# Patient Record
Sex: Female | Born: 1975 | Race: White | Hispanic: No | Marital: Married | Smoking: Never smoker
Health system: Southern US, Community
[De-identification: ages and names within clinical notes are randomized; demographics above are authoritative.]

## PROBLEM LIST (undated history)

## (undated) DIAGNOSIS — R5382 Chronic fatigue, unspecified: Secondary | ICD-10-CM

## (undated) DIAGNOSIS — F411 Generalized anxiety disorder: Secondary | ICD-10-CM

## (undated) DIAGNOSIS — F410 Panic disorder [episodic paroxysmal anxiety] without agoraphobia: Secondary | ICD-10-CM

## (undated) DIAGNOSIS — J45909 Unspecified asthma, uncomplicated: Secondary | ICD-10-CM

## (undated) DIAGNOSIS — F4000 Agoraphobia, unspecified: Secondary | ICD-10-CM

## (undated) DIAGNOSIS — E039 Hypothyroidism, unspecified: Secondary | ICD-10-CM

## (undated) DIAGNOSIS — F329 Major depressive disorder, single episode, unspecified: Secondary | ICD-10-CM

## (undated) HISTORY — DX: Agoraphobia, unspecified: F40.00

## (undated) HISTORY — DX: Major depressive disorder, single episode, unspecified: F32.9

## (undated) HISTORY — DX: Hypothyroidism, unspecified: E03.9

## (undated) HISTORY — DX: Chronic fatigue, unspecified: R53.82

## (undated) HISTORY — DX: Panic disorder (episodic paroxysmal anxiety): F41.0

## (undated) HISTORY — DX: Unspecified asthma, uncomplicated: J45.909

## (undated) HISTORY — PX: WISDOM TOOTH EXTRACTION: SHX21

## (undated) HISTORY — DX: Generalized anxiety disorder: F41.1

---
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
## 2000-11-18 ENCOUNTER — Emergency Department (HOSPITAL_COMMUNITY): Admission: EM | Admit: 2000-11-18 | Discharge: 2000-11-18 | Payer: Self-pay

## 2003-02-02 ENCOUNTER — Emergency Department (HOSPITAL_COMMUNITY): Admission: EM | Admit: 2003-02-02 | Discharge: 2003-02-02 | Payer: Self-pay | Admitting: Emergency Medicine

## 2009-04-04 DIAGNOSIS — J45909 Unspecified asthma, uncomplicated: Secondary | ICD-10-CM | POA: Insufficient documentation

## 2010-04-25 ENCOUNTER — Other Ambulatory Visit: Admission: RE | Admit: 2010-04-25 | Discharge: 2010-04-25 | Payer: Self-pay | Admitting: Family Medicine

## 2010-05-20 ENCOUNTER — Ambulatory Visit: Payer: Self-pay | Admitting: Diagnostic Radiology

## 2010-05-20 ENCOUNTER — Emergency Department (HOSPITAL_BASED_OUTPATIENT_CLINIC_OR_DEPARTMENT_OTHER)
Admission: EM | Admit: 2010-05-20 | Discharge: 2010-05-20 | Payer: Self-pay | Source: Home / Self Care | Admitting: Emergency Medicine

## 2010-05-22 ENCOUNTER — Emergency Department (HOSPITAL_BASED_OUTPATIENT_CLINIC_OR_DEPARTMENT_OTHER)
Admission: EM | Admit: 2010-05-22 | Discharge: 2010-05-22 | Payer: Self-pay | Source: Home / Self Care | Admitting: Emergency Medicine

## 2010-05-22 ENCOUNTER — Ambulatory Visit: Payer: Self-pay | Admitting: Diagnostic Radiology

## 2011-03-19 LAB — CBC
HCT: 40.5 % (ref 36.0–46.0)
Hemoglobin: 13.4 g/dL (ref 12.0–15.0)
MCHC: 33.1 g/dL (ref 30.0–36.0)
MCV: 87.4 fL (ref 78.0–100.0)
Platelets: 309 10*3/uL (ref 150–400)
RBC: 4.64 MIL/uL (ref 3.87–5.11)
RDW: 13 % (ref 11.5–15.5)
WBC: 15.1 K/uL — ABNORMAL HIGH (ref 4.0–10.5)

## 2011-03-19 LAB — BASIC METABOLIC PANEL WITH GFR
BUN: 8 mg/dL (ref 6–23)
Calcium: 9.2 mg/dL (ref 8.4–10.5)
Creatinine, Ser: 0.7 mg/dL (ref 0.4–1.2)
GFR calc non Af Amer: >60 mL/min (ref >60)
Sodium: 146 meq/L — ABNORMAL HIGH (ref 135–145)

## 2011-03-19 LAB — DIFFERENTIAL
Basophils Absolute: 0.1 K/uL (ref 0.0–0.1)
Basophils Relative: 0 % (ref 0–1)
Eosinophils Absolute: 0 K/uL (ref 0.0–0.7)
Eosinophils Relative: 0 % (ref 0–5)
Lymphocytes Relative: 15 % (ref 12–46)
Lymphs Abs: 2.2 10*3/uL (ref 0.7–4.0)
Monocytes Absolute: 1.1 10*3/uL — ABNORMAL HIGH (ref 0.1–1.0)
Monocytes Relative: 7 % (ref 3–12)
Neutro Abs: 11.7 10*3/uL — ABNORMAL HIGH (ref 1.7–7.7)
Neutrophils Relative %: 78 % — ABNORMAL HIGH (ref 43–77)

## 2011-03-19 LAB — BASIC METABOLIC PANEL
CO2: 28 mEq/L (ref 19–32)
Chloride: 103 mEq/L (ref 96–112)
GFR calc Af Amer: >60 mL/min (ref >60)
Glucose, Bld: 115 mg/dL — ABNORMAL HIGH (ref 70–99)
Potassium: 3.2 mEq/L — ABNORMAL LOW (ref 3.5–5.1)

## 2016-06-01 ENCOUNTER — Other Ambulatory Visit: Payer: Self-pay | Admitting: Obstetrics and Gynecology

## 2016-06-01 DIAGNOSIS — R928 Other abnormal and inconclusive findings on diagnostic imaging of breast: Secondary | ICD-10-CM

## 2016-06-07 ENCOUNTER — Other Ambulatory Visit: Payer: Self-pay

## 2016-06-13 ENCOUNTER — Ambulatory Visit
Admission: RE | Admit: 2016-06-13 | Discharge: 2016-06-13 | Disposition: A | Discharge status: Home / Self Care | Payer: No Typology Code available for payment source | Source: Ambulatory Visit | Attending: Obstetrics and Gynecology | Admitting: Obstetrics and Gynecology

## 2016-06-13 DIAGNOSIS — R928 Other abnormal and inconclusive findings on diagnostic imaging of breast: Secondary | ICD-10-CM

## 2017-10-08 ENCOUNTER — Telehealth: Payer: Self-pay

## 2017-10-08 ENCOUNTER — Telehealth: Payer: Self-pay | Admitting: Cardiology

## 2017-10-08 NOTE — Telephone Encounter (Signed)
SENT NOTES TO SCHEDULING 

## 2017-10-08 NOTE — Telephone Encounter (Signed)
Received records from [place] for appointment on 10/09/17 with Dr Percival Spanish.  Records put with Dr Hochrein's schedule for 10/09/17. lp

## 2017-10-08 NOTE — Progress Notes (Signed)
Cardiology Office Note   Date:  10/11/2017   ID:  Mallory Collins, DOB July 23, 1976, MRN [id number]  PCP:  Aretta Nip, MD  Cardiologist:   Minus Breeding, MD  Referring:  Aretta Nip, MD  Chief Complaint  Patient presents with  . Tachycardia      History of Present Illness: Mallory Collins is a 41 y.o. female who is referred by Mallory Collins, Mallory Salinas, MD for evaluation of palpitations.  The patient has had a history of a rapid heart rate some years. She says this has been rather restless. She's also had tachycardia palpitations. She describes these intermittent. She says these might happen every few days. They might last for 30 minutes. She describes rapid heartbeat but not necessarily irregular. She did wear an event monitor at another cardiology office. She had runs of supraventricular tachycardia. She had an echocardiogram was unremarkable. I was able to review these results. She's had last thyroid. She was treated at one point metoprolol without improvement.  She took propranolol pill in pocket but doesn't think this is doing anything. She's not having any presyncope or syncope.   The patient denies any new symptoms such as chest discomfort, neck or arm discomfort. There has been no new shortness of breath, PND or orthopnea.   She is not exercising but she can walk her dogs without limitations.    Past Medical History:  Diagnosis Date  . Asthma   . Chronic fatigue   . Hypothyroidism     Past Surgical History:  Procedure Laterality Date  . WISDOM TOOTH EXTRACTION       Current Outpatient Prescriptions  Medication Sig Dispense Refill  . albuterol (PROVENTIL HFA;VENTOLIN HFA) 108 (90 Base) MCG/ACT inhaler Inhale 2 puffs into the lungs every 6 (six) hours as needed for wheezing or shortness of breath.    . cholecalciferol (VITAMIN D) 1000 units tablet Take 1,000 Units by mouth daily.    . DULoxetine (CYMBALTA) 60 MG capsule Take 1 capsule by mouth daily.  1  .  levothyroxine (SYNTHROID, LEVOTHROID) 137 MCG tablet Take 137 mcg by mouth daily.  1  . LORazepam (ATIVAN) 0.5 MG tablet Take 0.5 mg by mouth 2 (two) times daily as needed for anxiety or sleep.     . Multiple Vitamin (MULTIVITAMIN) tablet Take 1 tablet by mouth daily.    . ondansetron (ZOFRAN) 4 MG tablet Take 4 mg by mouth daily as needed for nausea or vomiting.    . propranolol (INDERAL) 10 MG tablet Take 1 tablet by mouth as needed.  0  . diltiazem (CARDIZEM) 30 MG tablet Take 1 tablet (30 mg total) by mouth 2 (two) times daily. 60 tablet 6   No current facility-administered medications for this visit.     Allergies:   Patient has no known allergies.    Social History:  The patient  reports that she has never smoked. She has never used smokeless tobacco.   Family History:  The patient's family history includes CAD (age of onset: 18) in her mother; Cancer in her mother; Heart disease in her paternal grandmother; Hypertension in her mother.    ROS:  Please see the history of present illness.   Otherwise, review of systems are positive for none.   All other systems are reviewed and negative.    PHYSICAL EXAM: VS:  BP 116/80   Pulse 100   Ht 5\' 6"  (1.676 m)   Wt 143 lb (64.9 kg)   LMP 09/25/2017 (Approximate)  SpO2 98%   BMI 23.08 kg/m  , BMI Body mass index is 23.08 kg/m. GENERAL:  Well appearing HEENT:  Pupils equal round and reactive, fundi not visualized, oral mucosa unremarkable NECK:  No jugular venous distention, waveform within normal limits, carotid upstroke brisk and symmetric, no bruits, no thyromegaly LYMPHATICS:  No cervical, inguinal adenopathy LUNGS:  Clear to auscultation bilaterally BACK:  No CVA tenderness CHEST:  Unremarkable HEART:  PMI not displaced or sustained,S1 and S2 within normal limits, no S3, no S4, no clicks, no rubs, no murmurs ABD:  Flat, positive bowel sounds normal in frequency in pitch, no bruits, no rebound, no guarding, no midline pulsatile  mass, no hepatomegaly, no splenomegaly EXT:  2 plus pulses throughout, no edema, no cyanosis no clubbing SKIN:  No rashes no nodules NEURO:  Cranial nerves II through XII grossly intact, motor grossly intact throughout PSYCH:  Cognitively intact, oriented to person place and time    EKG:  EKG is ordered today. The ekg ordered today demonstrates sinus rhythm, rate 96, axis within normal limits, intervals within normal limits, no acute ST.   Recent Labs: No results found for requested labs within last 8760 hours.    Lipid Panel No results found for: CHOL, TRIG, HDL, CHOLHDL, VLDL, LDLCALC, LDLDIRECT    Wt Readings from Last 3 Encounters:  10/09/17 143 lb (64.9 kg)      Other studies Reviewed: Additional studies/ records that were reviewed today include: Office records. Review of the above records demonstrates:  Please see elsewhere in the note.     ASSESSMENT AND PLAN:  TACHYCARDIA:  The patient does seem to have some elevated resting tachycardia but doesn't quite reach the threshold for inappropriate sinus tachycardia. However, I still discussed with her primary therapy of exercise interval training. I would not suggest other therapy specifically for this.  SVT : She does seem to have some runs of supraventricular tach. I talked to her about trying Cardizem 30 mg immediate release twice daily and then we can titrate from there. We talked about vagal maneuvers. Further evaluation and treatment will be based on the results of an AliveCor that I suggested she buy. .    Current medicines are reviewed at length with the patient today.  The patient does not have concerns regarding medicines.  The following changes have been made:  As above  Labs/ tests ordered today include:  No orders of the defined types were placed in this encounter.    Disposition:   FU with me as needed.      Signed, Minus Breeding, MD  10/11/2017 7:41 AM    [facility]

## 2017-10-09 ENCOUNTER — Encounter: Payer: Self-pay | Admitting: *Deleted

## 2017-10-09 ENCOUNTER — Ambulatory Visit (INDEPENDENT_AMBULATORY_CARE_PROVIDER_SITE_OTHER): Payer: Self-pay | Admitting: Cardiology

## 2017-10-09 ENCOUNTER — Encounter: Payer: Self-pay | Admitting: Cardiology

## 2017-10-09 VITALS — BP 116/80 | HR 100 | Ht 66.0 in | Wt 143.0 lb

## 2017-10-09 DIAGNOSIS — I471 Supraventricular tachycardia: Secondary | ICD-10-CM

## 2017-10-09 DIAGNOSIS — R000 Tachycardia, unspecified: Secondary | ICD-10-CM

## 2017-10-09 MED ORDER — DILTIAZEM HCL 30 MG PO TABS: 30.0000 mg | ORAL_TABLET | Freq: Two times a day (BID) | ORAL | 6 refills | Status: DC

## 2017-10-09 NOTE — Patient Instructions (Signed)
Medication Instructions:  START- Cardizem 30 mg twice a day  If you need a refill on your cardiac medications before your next appointment, please call your pharmacy.  Labwork: None Ordered   Testing/Procedures: None Ordered  Follow-Up: Your physician wants you to follow-up in: As Needed.    Thank you for choosing [facility] at [facility]!!

## 2017-10-11 ENCOUNTER — Encounter: Payer: Self-pay | Admitting: Cardiology

## 2017-10-11 DIAGNOSIS — I471 Supraventricular tachycardia, unspecified: Secondary | ICD-10-CM | POA: Insufficient documentation

## 2017-10-11 DIAGNOSIS — R000 Tachycardia, unspecified: Secondary | ICD-10-CM | POA: Insufficient documentation

## 2017-10-14 NOTE — Addendum Note (Signed)
Addended by: Milderd Meager on: 10/14/2017 02:24 PM   Modules accepted: Orders

## 2019-03-23 DIAGNOSIS — E039 Hypothyroidism, unspecified: Secondary | ICD-10-CM | POA: Diagnosis not present

## 2019-03-23 DIAGNOSIS — F410 Panic disorder [episodic paroxysmal anxiety] without agoraphobia: Secondary | ICD-10-CM | POA: Diagnosis not present

## 2019-03-23 DIAGNOSIS — E559 Vitamin D deficiency, unspecified: Secondary | ICD-10-CM | POA: Diagnosis not present

## 2019-04-29 DIAGNOSIS — R000 Tachycardia, unspecified: Secondary | ICD-10-CM | POA: Diagnosis not present

## 2019-04-29 DIAGNOSIS — E039 Hypothyroidism, unspecified: Secondary | ICD-10-CM | POA: Diagnosis not present

## 2019-04-29 DIAGNOSIS — F410 Panic disorder [episodic paroxysmal anxiety] without agoraphobia: Secondary | ICD-10-CM | POA: Diagnosis not present

## 2019-04-30 DIAGNOSIS — F410 Panic disorder [episodic paroxysmal anxiety] without agoraphobia: Secondary | ICD-10-CM | POA: Diagnosis not present

## 2019-04-30 DIAGNOSIS — R000 Tachycardia, unspecified: Secondary | ICD-10-CM | POA: Diagnosis not present

## 2019-04-30 DIAGNOSIS — E039 Hypothyroidism, unspecified: Secondary | ICD-10-CM | POA: Diagnosis not present

## 2019-05-26 DIAGNOSIS — E039 Hypothyroidism, unspecified: Secondary | ICD-10-CM | POA: Diagnosis not present

## 2019-05-26 DIAGNOSIS — I471 Supraventricular tachycardia: Secondary | ICD-10-CM | POA: Diagnosis not present

## 2019-05-26 DIAGNOSIS — F410 Panic disorder [episodic paroxysmal anxiety] without agoraphobia: Secondary | ICD-10-CM | POA: Diagnosis not present

## 2019-06-12 ENCOUNTER — Telehealth: Payer: Self-pay | Admitting: Cardiology

## 2019-06-12 NOTE — Telephone Encounter (Signed)
LVM for patient to call and schedule OV. 

## 2019-06-15 ENCOUNTER — Telehealth: Payer: Self-pay | Admitting: Cardiology

## 2019-06-15 NOTE — Telephone Encounter (Signed)
LVM for patient to call and schedule appointment with Dr. Hochrein. °

## 2019-06-18 ENCOUNTER — Telehealth: Payer: Self-pay | Admitting: Cardiology

## 2019-06-18 NOTE — Telephone Encounter (Signed)
LVM for patient to call and schedule new patient appointment with Dr. Percival Spanish.

## 2019-06-30 NOTE — Progress Notes (Signed)
Virtual Visit via Video Note   This visit type was conducted due to national recommendations for restrictions regarding the COVID-19 Pandemic (e.g. social distancing) in an effort to limit this patient's exposure and mitigate transmission in our community.  Due to her co-morbid illnesses, this patient is at least at moderate risk for complications without adequate follow up.  This format is felt to be most appropriate for this patient at this time.  All issues noted in this document were discussed and addressed.  A limited physical exam was performed with this format.  Please refer to the patient's chart for her consent to telehealth for [facility].   Date:  07/01/2019   ID:  Mallory Collins, DOB 1976-04-19, MRN [id number]  Patient Location: Home Provider Location: Home  PCP:  Aretta Nip, MD  Cardiologist:  Hochrein   Electrophysiologist:  None   Evaluation Performed:  Follow-Up Visit  Chief Complaint:  Cardiac Evaluation with hx of palpitations.   History of Present Illness:    Mallory Collins is a 43 y.o. female we are following for ongoing assessment and management of palpitations.  She did wear an event monitor at another cardiology office and was found to have runs of supraventricular tachycardia.  She was last seen by Dr. Percival Spanish on 10/09/2017 for initial consultation and establishment with our practice.  She has other history of hypothyroidism, chronic fatigue, and asthma.  On that office visit she was noted to have elevated resting tachycardia but did not reach threshold for inappropriate sinus tachycardia.  She was advised to buy AiveCor, cardiac monitor for interpretation of her palpitations and rapid heart rate as an outpatient.  They discussed beginning diltiazem if necessary.  Since being seen last she has not had new diagnoses allergies to medications or surgeries.  She has been working with her primary care physician for hypothyroidism with medication adjustments.   Her primary care physician has placed her on Klonopin which is been very helpful for her rapid heart rhythm, and she has not had any further complaints since starting this medicine.  However on follow-up her primary care physician noticed that she was having splinter hemorrhages in all of her nailbeds, which is apparently a new finding.Marland Kitchen  PCP asked [place] to see her on follow-up.   The patient does not have symptoms concerning for COVID-19 infection (fever, chills, cough, or new shortness of breath).    Past Medical History:  Diagnosis Date  . Asthma   . Chronic fatigue   . Hypothyroidism       Current Meds  Medication Sig  . albuterol (PROVENTIL HFA;VENTOLIN HFA) 108 (90 Base) MCG/ACT inhaler Inhale 2 puffs into the lungs every 6 (six) hours as needed for wheezing or shortness of breath.  . cholecalciferol (VITAMIN D) 1000 units tablet Take 1,000 Units by mouth daily.  . clonazePAM (KLONOPIN) 1 MG tablet Take 0.5-1 tablets by mouth 2 (two) times daily as needed for anxiety.  Marland Kitchen diltiazem (CARDIZEM) 30 MG tablet Take 1 tablet (30 mg total) by mouth 2 (two) times daily.  . DULoxetine (CYMBALTA) 60 MG capsule Take 1 capsule by mouth daily.  Marland Kitchen levothyroxine (SYNTHROID) 150 MCG tablet Take 150 mcg by mouth every other day. Alternating w/ 137 mcg tablets  . levothyroxine (SYNTHROID, LEVOTHROID) 137 MCG tablet Take 137 mcg by mouth every other day. Alternating w/ 150 mcg tablets  . LORazepam (ATIVAN) 0.5 MG tablet Take 0.5 mg by mouth 2 (two) times daily as needed for anxiety or sleep.   Marland Kitchen  Multiple Vitamin (MULTIVITAMIN) tablet Take 1 tablet by mouth daily.  . ondansetron (ZOFRAN) 4 MG tablet Take 4 mg by mouth daily as needed for nausea or vomiting.  . propranolol (INDERAL) 10 MG tablet Take 1 tablet by mouth as needed.     Allergies:   Patient has no known allergies.   Social History   Tobacco Use  . Smoking status: Never Smoker  . Smokeless tobacco: Never Used  Substance Use Topics  .  Alcohol use: Not on file  . Drug use: Not on file     Family Hx: The patient's family history includes CAD (age of onset: 23) in her mother; Cancer in her mother; Heart disease in her paternal grandmother; Hypertension in her mother.  ROS:   Please see the history of present illness.    All other systems reviewed and are negative.   Prior CV studies:   The following studies were reviewed today: See scanned documents   Labs/Other Tests and Data Reviewed:    EKG:  No ECG reviewed.  Recent Labs: No results found for requested labs within last 8760 hours.   Recent Lipid Panel No results found for: CHOL, TRIG, HDL, CHOLHDL, LDLCALC, LDLDIRECT  Wt Readings from Last 3 Encounters:  07/01/19 140 lb (63.5 kg)  10/09/17 143 lb (64.9 kg)     Objective:    Vital Signs:  Ht 5\' 6"  (1.676 m)   Wt 140 lb (63.5 kg)   BMI 22.60 kg/m    VITAL SIGNS:  reviewed GEN:  no acute distress RESPIRATORY:  normal respiratory effort, symmetric expansion NEURO:  alert and oriented x 3, no obvious focal deficit PSYCH:  normal affect  ASSESSMENT & PLAN:    1. Palpations: Essentially resolved when PCP placed her on Klonopin.  She states that she has not noticed any further episodes or racing heart rate.  She has been off of caffeine now for 2 years.  However, she does have occasional extra beats but these are rare.  These are up concerned for her and she is anxious about having problems with her heart again.  I am ordering a 2-week cardiac monitor to evaluate her heart rhythm, rate, and for arrhythmias.  I doubt that we will see significant abnormalities as she is completely asymptomatic since starting Klonopin however she would like to know what her heart rate is doing.  We will therefore, look again.  2.  Splinter hemorrhages: Noted to be under her nails by her PCP.  I will order an echocardiogram to evaluate for vegetation or changes in LV function.  The patient is asymptomatic with no fevers,  myalgias, chest pain, or malaise.  Doubt endocarditis.  3.  Hypothyroidism: Being followed by primary care with medication dosage adjustments.  COVID-19 Education: The signs and symptoms of COVID-19 were discussed with the patient and how to seek care for testing (follow up with PCP or arrange E-visit).  The importance of social distancing was discussed today.  Time:   Today, I have spent 15 minutes with the patient with telehealth technology discussing the above problems.     Medication Adjustments/Labs and Tests Ordered: Current medicines are reviewed at length with the patient today.  Concerns regarding medicines are outlined above.   Tests Ordered: Orders Placed This Encounter  Procedures  . LONG TERM MONITOR (3-14 DAYS)  . ECHOCARDIOGRAM COMPLETE    Medication Changes: No orders of the defined types were placed in this encounter.   Disposition:  Follow up 3-4 weeks.  Signed, Phill Myron. West Pugh, ANP, AACC  07/01/2019 2:48 PM    [facility]

## 2019-07-01 ENCOUNTER — Telehealth (INDEPENDENT_AMBULATORY_CARE_PROVIDER_SITE_OTHER): Payer: No Typology Code available for payment source | Admitting: Adult Health

## 2019-07-01 ENCOUNTER — Telehealth: Payer: Self-pay | Admitting: Radiology

## 2019-07-01 VITALS — Ht 66.0 in | Wt 140.0 lb

## 2019-07-01 DIAGNOSIS — R000 Tachycardia, unspecified: Secondary | ICD-10-CM

## 2019-07-01 DIAGNOSIS — I519 Heart disease, unspecified: Secondary | ICD-10-CM

## 2019-07-01 NOTE — Patient Instructions (Signed)
Medication Instructions:  Continue current medications  If you need a refill on your cardiac medications before your next appointment, please call your pharmacy.  Labwork: None Ordered   Testing/Procedures: Your physician has requested that you have an echocardiogram. Echocardiography is a painless test that uses sound waves to create images of your heart. It provides your doctor with information about the size and shape of your heart and how well your heart's chambers and valves are working. This procedure takes approximately one hour. There are no restrictions for this procedure.  Your physician has recommended that you wear a Zio Patch monitor. Zio monitors are medical devices that record the heart's electrical activity. Doctors most often use these monitors to diagnose arrhythmias. Arrhythmias are problems with the speed or rhythm of the heartbeat. The monitor is a small, portable device. You can wear one while you do your normal daily activities. This is usually used to diagnose what is causing palpitations/syncope (passing out).  Follow-Up: . Your physician recommends that you schedule a follow-up appointment in: 1 Month with Dr Percival Spanish   At [facility], you and your health needs are our priority.  As part of our continuing mission to provide you with exceptional heart care, we have created designated Provider Care Teams.  These Care Teams include your primary Cardiologist (physician) and Advanced Practice Providers (APPs -  Physician Assistants and Nurse Practitioners) who all work together to provide you with the care you need, when you need it.  Thank you for choosing [facility] at [facility] [place]!!

## 2019-07-01 NOTE — Telephone Encounter (Signed)
Enrolled patient for a 14 day Zio monitor to be mailed. Brief instructions were gone over and patient knows to expect the monitor to arrive in 3-4 days 

## 2019-07-02 ENCOUNTER — Telehealth: Payer: Self-pay | Admitting: *Deleted

## 2019-07-02 NOTE — Telephone Encounter (Signed)
Left message for patient to call and schedule Echo and 1 month follow up visit with Dr. Percival Spanish

## 2019-07-07 NOTE — Telephone Encounter (Signed)
Left message for patient to call and schedule Echo and 1 month follow up appointment with Dr. Percival Spanish

## 2019-07-09 NOTE — Telephone Encounter (Signed)
Left message for patient to call and schedule Echo and 1 month follow up with Dr. Percival Spanish 07/02/19--07/06/19--07/07/19.  Called 07/09/19 and voice mail was full.  Sent My Chart message to patient requesting she call.

## 2019-07-11 ENCOUNTER — Other Ambulatory Visit (INDEPENDENT_AMBULATORY_CARE_PROVIDER_SITE_OTHER): Payer: Self-pay

## 2019-07-11 DIAGNOSIS — R000 Tachycardia, unspecified: Secondary | ICD-10-CM

## 2019-07-15 NOTE — Telephone Encounter (Signed)
Left message for patient to call and schedule Echo and 1 month follow up appointment with Dr. Percival Spanish

## 2019-07-16 DIAGNOSIS — E039 Hypothyroidism, unspecified: Secondary | ICD-10-CM | POA: Diagnosis not present

## 2019-07-17 ENCOUNTER — Encounter

## 2019-07-21 NOTE — Telephone Encounter (Signed)
Patient has not responded to messages left 07/02/19, 07/06/19,07/07/19,07/15/19 or a MY Chart message sent on 07/09/19

## 2019-08-03 ENCOUNTER — Ambulatory Visit (HOSPITAL_COMMUNITY): Payer: Self-pay | Attending: Cardiology

## 2019-08-03 ENCOUNTER — Other Ambulatory Visit: Payer: Self-pay

## 2019-08-03 DIAGNOSIS — I519 Heart disease, unspecified: Secondary | ICD-10-CM | POA: Insufficient documentation

## 2019-09-17 ENCOUNTER — Encounter

## 2019-12-14 DIAGNOSIS — F410 Panic disorder [episodic paroxysmal anxiety] without agoraphobia: Secondary | ICD-10-CM | POA: Diagnosis not present

## 2019-12-14 DIAGNOSIS — E039 Hypothyroidism, unspecified: Secondary | ICD-10-CM | POA: Diagnosis not present

## 2019-12-14 DIAGNOSIS — F411 Generalized anxiety disorder: Secondary | ICD-10-CM | POA: Diagnosis not present

## 2020-05-21 ENCOUNTER — Other Ambulatory Visit: Payer: Self-pay | Admitting: Physician Assistant

## 2020-05-21 DIAGNOSIS — J45909 Unspecified asthma, uncomplicated: Secondary | ICD-10-CM

## 2020-05-21 DIAGNOSIS — U071 COVID-19: Secondary | ICD-10-CM

## 2020-05-21 MED ORDER — SODIUM CHLORIDE 0.9 % IV SOLN
Freq: Once | INTRAVENOUS | Status: DC
  Filled 2020-05-21: qty 20

## 2020-05-21 NOTE — Progress Notes (Signed)
  I connected by phone with Mallory Collins on 05/21/2020 at 8:33 AM to discuss the potential use of an new treatment for mild to moderate COVID-19 viral infection in non-hospitalized patients.  This patient is a 44 y.o. female that meets the FDA criteria for Emergency Use Authorization of bamlanivimab/etesevimab or casirivimab/imdevimab.  Has a (+) direct SARS-CoV-2 viral test result  Has mild or moderate COVID-19   Is ? 45 years of age and weighs ? 40 kg  Is NOT hospitalized due to COVID-19  Is NOT requiring oxygen therapy or requiring an increase in baseline oxygen flow rate due to COVID-19  Is within 10 days of symptom onset  Has at least one of the high risk factor(s) for progression to severe COVID-19 and/or hospitalization as defined in EUA.  Specific high risk criteria : asthma   I have spoken and communicated the following to the patient or parent/caregiver:  1. FDA has authorized the emergency use of bamlanivimab/etesevimab and casirivimab\imdevimab for the treatment of mild to moderate COVID-19 in adults and pediatric patients with positive results of direct SARS-CoV-2 viral testing who are 40 years of age and older weighing at least 40 kg, and who are at high risk for progressing to severe COVID-19 and/or hospitalization.  2. The significant known and potential risks and benefits of bamlanivimab/etesevimab and casirivimab\imdevimab, and the extent to which such potential risks and benefits are unknown.  3. Information on available alternative treatments and the risks and benefits of those alternatives, including clinical trials.  4. Patients treated with bamlanivimab/etesevimab and casirivimab\imdevimab should continue to self-isolate and use infection control measures (e.g., wear mask, isolate, social distance, avoid sharing personal items, clean and disinfect "high touch" surfaces, and frequent handwashing) according to CDC guidelines.   5. The patient or parent/caregiver has  the option to accept or refuse bamlanivimab/etesevimab or casirivimab\imdevimab .  After reviewing this information with the patient, The patient agreed to proceed with receiving the bamlanivimab/etesevimab infusion and will be provided a copy of the Fact sheet prior to receiving the infusion.  Sx onset 5/18. Set up for infusion tomorrow 5/23 @ 9:30am   Angelena Form 05/21/2020 8:33 AM

## 2020-05-22 ENCOUNTER — Ambulatory Visit (HOSPITAL_COMMUNITY)
Admission: RE | Admit: 2020-05-22 | Discharge: 2020-05-22 | Disposition: A | Discharge status: Home / Self Care | Payer: BC Managed Care – PPO | Source: Ambulatory Visit | Attending: Pulmonary Disease | Admitting: Pulmonary Disease

## 2020-05-22 DIAGNOSIS — U071 COVID-19: Secondary | ICD-10-CM

## 2020-05-22 DIAGNOSIS — J45909 Unspecified asthma, uncomplicated: Secondary | ICD-10-CM

## 2020-05-24 ENCOUNTER — Ambulatory Visit: Payer: Self-pay | Admitting: Family Medicine

## 2020-06-07 ENCOUNTER — Encounter: Payer: Self-pay | Admitting: Family Medicine

## 2020-06-07 ENCOUNTER — Ambulatory Visit (INDEPENDENT_AMBULATORY_CARE_PROVIDER_SITE_OTHER): Payer: BC Managed Care – PPO | Admitting: Family Medicine

## 2020-06-07 ENCOUNTER — Other Ambulatory Visit: Payer: Self-pay

## 2020-06-07 VITALS — BP 132/78 | HR 125 | Temp 97.9°F | Ht 64.0 in | Wt 140.1 lb

## 2020-06-07 DIAGNOSIS — J4520 Mild intermittent asthma, uncomplicated: Secondary | ICD-10-CM

## 2020-06-07 DIAGNOSIS — F419 Anxiety disorder, unspecified: Secondary | ICD-10-CM | POA: Diagnosis not present

## 2020-06-07 DIAGNOSIS — D229 Melanocytic nevi, unspecified: Secondary | ICD-10-CM

## 2020-06-07 DIAGNOSIS — M797 Fibromyalgia: Secondary | ICD-10-CM

## 2020-06-07 DIAGNOSIS — E039 Hypothyroidism, unspecified: Secondary | ICD-10-CM | POA: Diagnosis not present

## 2020-06-07 NOTE — Progress Notes (Signed)
  Subjective:     Patient ID: Mallory Collins, female   DOB: 02/26/1976, 44 y.o.   MRN: [id number]  HPI   Mallory Collins is seen to establish care.  She has history of SVT which has been followed by cardiology.  Takes as needed beta-blockers.  She has reported mild intermittent asthma and rarely has asthma flares.  She has reported history of Hashimoto's thyroiditis with subsequent hypothyroidism.  She is on replacement.  She states her last levels were checked about a year ago.  She has had some anxiety issues over the past several months.  She states since the pandemic she has rarely left her house over the past 6 months.  She has reported fibromyalgia history and is on Cymbalta.  She specifically would like to see psychiatrist.  She has taken things like Klonopin but felt more fatigued with that.  She feels that she predominately has anxiety symptoms rather than depression.  She prefers to try to avoid benzodiazepines chronically.  She states she was diagnosed with Covid several weeks ago.  She has had some fatigue as possible residual symptom but had some fatigue even prior to that.  No cough.  No significant dyspnea.  Family history significant for mother having breast cancer history.  She states her father had amyloidosis with multiple organ involvement including end-stage renal disease.  She has 1 sister who is fairly healthy.  Pt sees gyn and gets regular mammograms.  She is married.  Non-smoker.  No alcohol use.  Past Medical History:  Diagnosis Date  . Asthma   . Chronic fatigue   . Hypothyroidism      reports that she has never smoked. She has never used smokeless tobacco. No history on file for alcohol and drug. family history includes CAD (age of onset: 65) in her mother; Cancer in her mother; Heart disease in her paternal grandmother; Hypertension in her mother. No Known Allergies   Review of Systems  Constitutional: Negative for appetite change, chills, fever and unexpected weight  change.  Respiratory: Negative for cough and shortness of breath.   Cardiovascular: Negative for chest pain.       Objective:   Physical Exam Constitutional:      Appearance: She is well-developed.  Eyes:     Pupils: Pupils are equal, round, and reactive to light.  Neck:     Thyroid: No thyromegaly.     Vascular: No JVD.  Cardiovascular:     Rate and Rhythm: Normal rate and regular rhythm.     Heart sounds: No gallop.   Pulmonary:     Effort: Pulmonary effort is normal. No respiratory distress.     Breath sounds: Normal breath sounds. No wheezing or rales.  Musculoskeletal:     Cervical back: Neck supple.  Neurological:     Mental Status: She is alert.        Assessment:     #1 hypothyroidism.  She is on replacement.  History of reported Hashimoto's thyroiditis.  Due for follow-up labs  #2 history of mild intermittent asthma  #3 history of reported fibromyalgia  #4 increased anxiety symptoms since the pandemic    Plan:     -Future order placed for TSH -We will need refills of her levothyroxine soon -Place order for psychiatry referral per her request -Set up dermatology referral for basic skin cancer screening.  She has had some "precancerous" (?dysplastic) lesions previously  Eulas Post MD [facility] at [facility]

## 2020-06-17 ENCOUNTER — Encounter: Payer: Self-pay | Admitting: Family Medicine

## 2020-06-20 MED ORDER — LORAZEPAM 0.5 MG PO TABS: 0.5000 mg | ORAL_TABLET | Freq: Two times a day (BID) | ORAL | 0 refills | Status: DC | PRN

## 2020-06-20 MED ORDER — ONDANSETRON HCL 4 MG PO TABS: 4.0000 mg | ORAL_TABLET | Freq: Three times a day (TID) | ORAL | 0 refills | Status: DC | PRN

## 2020-06-20 MED ORDER — DULOXETINE HCL 60 MG PO CPEP: 60.0000 mg | ORAL_CAPSULE | Freq: Every day | ORAL | 1 refills | Status: DC

## 2020-06-20 NOTE — Telephone Encounter (Signed)
I went ahead and gave her a refill of these medications until she can see psychiatry.  I did not see Klonopin on her list.

## 2020-07-15 ENCOUNTER — Other Ambulatory Visit: Payer: Self-pay | Admitting: Family Medicine

## 2020-08-11 ENCOUNTER — Other Ambulatory Visit: Payer: Self-pay

## 2020-08-11 ENCOUNTER — Ambulatory Visit (INDEPENDENT_AMBULATORY_CARE_PROVIDER_SITE_OTHER): Payer: BC Managed Care – PPO | Admitting: Adult Health

## 2020-08-11 ENCOUNTER — Encounter: Payer: Self-pay | Admitting: Adult Health

## 2020-08-11 VITALS — BP 150/89 | HR 113 | Ht 65.0 in | Wt 142.0 lb

## 2020-08-11 DIAGNOSIS — F331 Major depressive disorder, recurrent, moderate: Secondary | ICD-10-CM | POA: Diagnosis not present

## 2020-08-11 DIAGNOSIS — F411 Generalized anxiety disorder: Secondary | ICD-10-CM

## 2020-08-11 DIAGNOSIS — F410 Panic disorder [episodic paroxysmal anxiety] without agoraphobia: Secondary | ICD-10-CM

## 2020-08-11 DIAGNOSIS — F4000 Agoraphobia, unspecified: Secondary | ICD-10-CM

## 2020-08-11 MED ORDER — LORAZEPAM 0.5 MG PO TABS: 0.5000 mg | ORAL_TABLET | Freq: Every day | ORAL | 2 refills | Status: DC

## 2020-08-11 NOTE — Progress Notes (Signed)
[facility] MD/PA/NP Initial Note  08/11/2020 1:31 PM Patrick Sohm  MRN:  [id number]  Chief Complaint:   HPI:  Describes mood today as "ok". Denies tearfulness. Mood symptoms - reports depression and anxiety. Denies irritability. Stating "I can't handle any stress". Reports symptoms starting in the summer of last year and have progressively gotten worse. Not wanting to leave the house - "not at all". With the exception of doctor's appointment, she hasn't left the house since 2020. PCP gave her you Clonazepam to take when having increased anxiety/panic attacks. Tried taking, but it is too sedating - "it knocks me out". Has taken Cymbalta for Fibromyalgia and has increased to 60mg  daily - "has not helped with mood symptoms". Wanting help with anxiety and feeling agoraphobia. Would like to take something on an as needed basis. Has taken Ativan in the past when she used to travel and felt like it worked well. Likes to read, work around [employer], and watch TV. Stable interest and motivation. Taking medications as prescribed.  Energy levels lower. Active, does not have a regular exercise routine.  Enjoys some usual interests and activities. Married. Lives with husband and mother - married in 2003. Has 3 dogs. Has been with husband since 2003. Parents and extended family local. Spending time with family. Appetite adequate. Weight gain - 142 pounds.  Sleeps well most nights. Averages 10 hours. Focus and concentration stable. Completing tasks. Managing aspects of household. Unable to work - Fibromyalgia. Denies SI or HI. Denies AH or VH.  Previous medication trials: Clonazepam - too sedating  Visit Diagnosis:    ICD-10-CM   1. Generalized anxiety disorder  F41.1   2. Major depressive disorder, recurrent episode, moderate (HCC)  F33.1   3. Panic attacks  F41.0   4. Agoraphobia  F40.00     Past Psychiatric History: Denies psychiatric hospitalization.  Past Medical History:  Past Medical  History:  Diagnosis Date  . Agoraphobia   . Asthma   . Chronic fatigue   . GAD (generalized anxiety disorder)   . Hypothyroidism   . MDD (major depressive disorder)   . Panic attacks     Past Surgical History:  Procedure Laterality Date  . WISDOM TOOTH EXTRACTION      Family Psychiatric History: Denies any family history of mental illness.  Family History:  Family History  Problem Relation Age of Onset  . Hypertension Mother   . Cancer Mother        breast  . CAD Mother 48       CABG, stents  . Heart disease Paternal Grandmother     Social History:  Social History   Socioeconomic History  . Marital status: Married    Spouse name: Not on file  . Number of children: Not on file  . Years of education: Not on file  . Highest education level: Not on file  Occupational History  . Not on file  Tobacco Use  . Smoking status: Never Smoker  . Smokeless tobacco: Never Used  Substance and Sexual Activity  . Alcohol use: Never  . Drug use: Never  . Sexual activity: Not on file  Other Topics Concern  . Not on file  Social History Narrative  . Not on file   Social Determinants of Health   Financial Resource Strain:   . Difficulty of Paying Living Expenses:   Food Insecurity:   . Worried About Charity fundraiser in the Last Year:   . [employer]  in the Last Year:   Transportation Needs:   . Film/video editor (Medical):   Marland Kitchen Lack of Transportation (Non-Medical):   Physical Activity:   . Days of Exercise per Week:   . Minutes of Exercise per Session:   Stress:   . Feeling of Stress :   Social Connections:   . Frequency of Communication with Friends and Family:   . Frequency of Social Gatherings with Friends and Family:   . Attends Religious Services:   . Active Member of Clubs or Organizations:   . Attends Archivist Meetings:   Marland Kitchen Marital Status:     Allergies: No Known Allergies  Metabolic Disorder Labs: No results found for: HGBA1C,  MPG No results found for: PROLACTIN No results found for: CHOL, TRIG, HDL, CHOLHDL, VLDL, LDLCALC No results found for: TSH  Therapeutic Level Labs: No results found for: LITHIUM No results found for: VALPROATE No components found for:  CBMZ  Current Medications: Current Outpatient Medications  Medication Sig Dispense Refill  . albuterol (PROVENTIL HFA;VENTOLIN HFA) 108 (90 Base) MCG/ACT inhaler Inhale 2 puffs into the lungs every 6 (six) hours as needed for wheezing or shortness of breath.    . cholecalciferol (VITAMIN D) 1000 units tablet Take 1,000 Units by mouth daily.    Marland Kitchen diltiazem (CARDIZEM) 30 MG tablet Take 1 tablet (30 mg total) by mouth 2 (two) times daily. 60 tablet 6  . DULoxetine (CYMBALTA) 60 MG capsule TAKE 1 CAPSULE (60 MG TOTAL) BY MOUTH DAILY. 90 capsule 1  . levothyroxine (SYNTHROID, LEVOTHROID) 137 MCG tablet Take 137 mcg by mouth every other day. Alternating w/ 150 mcg tablets  1  . LORazepam (ATIVAN) 0.5 MG tablet Take 1 tablet (0.5 mg total) by mouth 2 (two) times daily as needed for anxiety or sleep. 15 tablet 0  . Multiple Vitamin (MULTIVITAMIN) tablet Take 1 tablet by mouth daily.    . ondansetron (ZOFRAN) 4 MG tablet Take 1 tablet (4 mg total) by mouth every 8 (eight) hours as needed for nausea or vomiting. 20 tablet 0  . propranolol (INDERAL) 10 MG tablet Take 1 tablet by mouth as needed.  0   No current facility-administered medications for this visit.    Medication Side Effects: none  Orders placed this visit:  No orders of the defined types were placed in this encounter.   Psychiatric Specialty Exam:  Review of Systems  Blood pressure (!) 150/89, pulse (!) 113, height 5\' 5"  (1.651 m), weight 142 lb (64.4 kg).Body mass index is 23.63 kg/m.  General Appearance: Neat and Well Groomed  Eye Contact:  Good  Speech:  Clear and Coherent and Normal Rate  Volume:  Normal  Mood:  Anxious  Affect:  Appropriate and Congruent  Thought Process:  Coherent and  Descriptions of Associations: Intact  Orientation:  Full (Time, Place, and Person)  Thought Content: Logical   Suicidal Thoughts:  No  Homicidal Thoughts:  No  Memory:  WNL  Judgement:  Good  Insight:  Good  Psychomotor Activity:  Normal  Concentration:  Concentration: Good  Recall:  Good  Fund of Knowledge: Good  Language: Good  Assets:  Communication Skills Desire for Improvement Financial Resources/Insurance Housing Intimacy Leisure Time Physical Health Resilience Social Support Talents/Skills Transportation Vocational/Educational  ADL's:  Intact  Cognition: WNL  Prognosis:  Good   Screenings:  PHQ2-9     Office Visit from 06/07/2020 in [facility] [place]  PHQ-2 Total Score 0  Receiving Psychotherapy: No   Treatment Plan/Recommendations:   Plan:  PDMP reviewed  1. Add Ativan 0.5mg  daily as needed for anxiety/agoraphobia  RTC 3 months  Patient advised to contact office with any questions, adverse effects, or acute worsening in signs and symptoms.  Discussed potential benefits, risk, and side effects of benzodiazepines to include potential risk of tolerance and dependence, as well as possible drowsiness.  Advised patient not to drive if experiencing drowsiness and to take lowest possible effective dose to minimize risk of dependence and tolerance.   Aloha Gell, NP

## 2020-08-18 ENCOUNTER — Telehealth: Payer: Self-pay | Admitting: Family Medicine

## 2020-08-18 NOTE — Telephone Encounter (Signed)
Pt is calling in needing a refill on levothyroxine (SYNTHROID, LEVOTHROID) 137 MCG  Pharm:  [facility] on [address]

## 2020-08-19 MED ORDER — LEVOTHYROXINE SODIUM 137 MCG PO TABS: 137.0000 ug | ORAL_TABLET | ORAL | 0 refills | Status: DC

## 2020-08-19 NOTE — Telephone Encounter (Signed)
Please advise have not prescribed before and no recent tsh

## 2020-08-19 NOTE — Telephone Encounter (Signed)
Refill for 3 months only until lab can be obtained.

## 2020-08-19 NOTE — Telephone Encounter (Signed)
Medication sent in. 

## 2020-08-19 NOTE — Telephone Encounter (Signed)
Pt needs to schedule lab appt for tsh future order has already been placed

## 2020-10-06 ENCOUNTER — Encounter: Payer: Self-pay | Admitting: Adult Health

## 2020-10-06 ENCOUNTER — Telehealth (INDEPENDENT_AMBULATORY_CARE_PROVIDER_SITE_OTHER): Payer: BC Managed Care – PPO | Admitting: Adult Health

## 2020-10-06 DIAGNOSIS — F410 Panic disorder [episodic paroxysmal anxiety] without agoraphobia: Secondary | ICD-10-CM | POA: Diagnosis not present

## 2020-10-06 DIAGNOSIS — F4000 Agoraphobia, unspecified: Secondary | ICD-10-CM

## 2020-10-06 DIAGNOSIS — F411 Generalized anxiety disorder: Secondary | ICD-10-CM

## 2020-10-06 DIAGNOSIS — F331 Major depressive disorder, recurrent, moderate: Secondary | ICD-10-CM | POA: Diagnosis not present

## 2020-10-06 MED ORDER — LORAZEPAM 0.5 MG PO TABS: 0.5000 mg | ORAL_TABLET | Freq: Every day | ORAL | 2 refills | Status: DC

## 2020-10-06 NOTE — Progress Notes (Signed)
Mallory Collins [id number] 02/16/1976 44 y.o.  Virtual Visit via Telephone Note  I connected with pt on 10/06/20 at  1:40 PM EDT by telephone and verified that I am speaking with the correct person using two identifiers.   I discussed the limitations, risks, security and privacy concerns of performing an evaluation and management service by telephone and the availability of in person appointments. I also discussed with the patient that there may be a patient responsible charge related to this service. The patient expressed understanding and agreed to proceed.   I discussed the assessment and treatment plan with the patient. The patient was provided an opportunity to ask questions and all were answered. The patient agreed with the plan and demonstrated an understanding of the instructions.   The patient was advised to call back or seek an in-person evaluation if the symptoms worsen or if the condition fails to improve as anticipated.  I provided 30 minutes of non-face-to-face time during this encounter.  The patient was located at home.  The provider was located at [place].   Aloha Gell, NP   Subjective:   Patient ID:  Mallory Collins is a 44 y.o. (DOB 07-01-1976) female.  Chief Complaint: No chief complaint on file.   HPI Hadiyah Maricle presents for follow-up of GAD, MDD, panic attacks, agoraphobia.   Describes mood today as "ok". Denies tearfulness. Mood symptoms - reports depression and anxiety. Denies irritability. Stating "I do struggle getting out". Feels like addition of Ativan has been helpful. Stable interest and motivation. Taking medications as prescribed.  Energy levels vary. Active, does not have a regular exercise routine.  Enjoys some usual interests and activities. Married. Lives with husband and mother - married in 2003. Has 3 dogs. Spending time with family. Appetite adequate. Weight gain - 142 pounds.  Sleeps well most nights.  Averages 10 hours. Focus and concentration stable. Completing tasks. Managing aspects of household. Unable to work - Fibromyalgia. Denies SI or HI. Denies AH or VH.  Previous medication trials: Clonazepam - too sedating  Review of Systems:  Review of Systems  Musculoskeletal: Negative for gait problem.  Neurological: Negative for tremors.  Psychiatric/Behavioral:       Please refer to HPI    Medications: I have reviewed the patient's current medications.  Current Outpatient Medications  Medication Sig Dispense Refill   albuterol (PROVENTIL HFA;VENTOLIN HFA) 108 (90 Base) MCG/ACT inhaler Inhale 2 puffs into the lungs every 6 (six) hours as needed for wheezing or shortness of breath.     cholecalciferol (VITAMIN D) 1000 units tablet Take 1,000 Units by mouth daily.     diltiazem (CARDIZEM) 30 MG tablet Take 1 tablet (30 mg total) by mouth 2 (two) times daily. 60 tablet 6   DULoxetine (CYMBALTA) 60 MG capsule TAKE 1 CAPSULE (60 MG TOTAL) BY MOUTH DAILY. 90 capsule 1   levothyroxine (SYNTHROID) 137 MCG tablet Take 1 tablet (137 mcg total) by mouth every other day. Alternating w/ 150 mcg tablets 90 tablet 0   LORazepam (ATIVAN) 0.5 MG tablet Take 1 tablet (0.5 mg total) by mouth daily. 30 tablet 2   Multiple Vitamin (MULTIVITAMIN) tablet Take 1 tablet by mouth daily.     ondansetron (ZOFRAN) 4 MG tablet Take 1 tablet (4 mg total) by mouth every 8 (eight) hours as needed for nausea or vomiting. 20 tablet 0   propranolol (INDERAL) 10 MG tablet Take 1 tablet by mouth as needed.  0   No current  facility-administered medications for this visit.    Medication Side Effects: None  Allergies: No Known Allergies  Past Medical History:  Diagnosis Date   Agoraphobia    Asthma    Chronic fatigue    GAD (generalized anxiety disorder)    Hypothyroidism    MDD (major depressive disorder)    Panic attacks     Family History  Problem Relation Age of Onset   Hypertension  Mother    Cancer Mother        breast   CAD Mother 57       CABG, stents   Heart disease Paternal Grandmother     Social History   Socioeconomic History   Marital status: Married    Spouse name: Not on file   Number of children: Not on file   Years of education: Not on file   Highest education level: Not on file  Occupational History   Not on file  Tobacco Use   Smoking status: Never Smoker   Smokeless tobacco: Never Used  Substance and Sexual Activity   Alcohol use: Never   Drug use: Never   Sexual activity: Not on file  Other Topics Concern   Not on file  Social History Narrative   Not on file   Social Determinants of Health   Financial Resource Strain:    Difficulty of Paying Living Expenses: Not on file  Food Insecurity:    Worried About Running Out of Food in the Last Year: Not on file   Ran Out of Food in the Last Year: Not on file  Transportation Needs:    Lack of Transportation (Medical): Not on file   Lack of Transportation (Non-Medical): Not on file  Physical Activity:    Days of Exercise per Week: Not on file   Minutes of Exercise per Session: Not on file  Stress:    Feeling of Stress : Not on file  Social Connections:    Frequency of Communication with Friends and Family: Not on file   Frequency of Social Gatherings with Friends and Family: Not on file   Attends Religious Services: Not on file   Active Member of Clubs or Organizations: Not on file   Attends Archivist Meetings: Not on file   Marital Status: Not on file  Intimate Partner Violence:    Fear of Current or Ex-Partner: Not on file   Emotionally Abused: Not on file   Physically Abused: Not on file   Sexually Abused: Not on file    Past Medical History, Surgical history, Social history, and Family history were reviewed and updated as appropriate.   Please see review of systems for further details on the patient's review from today.    Objective:   Physical Exam:  There were no vitals taken for this visit.  Physical Exam  Lab Review:     Component Value Date/Time   NA 146 (H) 05/22/2010 0100   K 3.2 (L) 05/22/2010 0100   CL 103 05/22/2010 0100   CO2 28 05/22/2010 0100   GLUCOSE 115 (H) 05/22/2010 0100   BUN 8 05/22/2010 0100   CREATININE 0.7 05/22/2010 0100   CALCIUM 9.2 05/22/2010 0100   GFRNONAA >60 05/22/2010 0100   GFRAA  05/22/2010 0100    >60        The eGFR has been calculated using the MDRD equation. This calculation has not been validated in all clinical situations. eGFR's persistently <60 mL/min signify possible Chronic Kidney Disease.  Component Value Date/Time   WBC 15.1 (H) 05/22/2010 0100   RBC 4.64 05/22/2010 0100   HGB 13.4 05/22/2010 0100   HCT 40.5 05/22/2010 0100   PLT 309 05/22/2010 0100   MCV 87.4 05/22/2010 0100   MCHC 33.1 05/22/2010 0100   RDW 13.0 05/22/2010 0100   LYMPHSABS 2.2 05/22/2010 0100   MONOABS 1.1 (H) 05/22/2010 0100   EOSABS 0.0 05/22/2010 0100   BASOSABS 0.1 05/22/2010 0100    No results found for: POCLITH, LITHIUM   No results found for: PHENYTOIN, PHENOBARB, VALPROATE, CBMZ   .res Assessment: Plan:    Plan:  PDMP reviewed  1. Add Ativan 0.35m daily as needed for anxiety/agoraphobia  RTC 3 months  Patient advised to contact office with any questions, adverse effects, or acute worsening in signs and symptoms.  Discussed potential benefits, risk, and side effects of benzodiazepines to include potential risk of tolerance and dependence, as well as possible drowsiness.  Advised patient not to drive if experiencing drowsiness and to take lowest possible effective dose to minimize risk of dependence and tolerance.  Diagnoses and all orders for this visit:  Generalized anxiety disorder -     LORazepam (ATIVAN) 0.5 MG tablet; Take 1 tablet (0.5 mg total) by mouth daily.  Major depressive disorder, recurrent episode, moderate  (HCC)  Panic attacks -     LORazepam (ATIVAN) 0.5 MG tablet; Take 1 tablet (0.5 mg total) by mouth daily.  Agoraphobia -     LORazepam (ATIVAN) 0.5 MG tablet; Take 1 tablet (0.5 mg total) by mouth daily.    Please see After Visit Summary for patient specific instructions.  No future appointments.  No orders of the defined types were placed in this encounter.     -------------------------------

## 2020-11-15 ENCOUNTER — Other Ambulatory Visit: Payer: Self-pay | Admitting: Family Medicine

## 2020-11-17 ENCOUNTER — Telehealth: Payer: BC Managed Care – PPO | Admitting: Emergency Medicine

## 2020-11-17 DIAGNOSIS — R300 Dysuria: Secondary | ICD-10-CM

## 2020-11-17 MED ORDER — CEPHALEXIN 500 MG PO CAPS: 500.0000 mg | ORAL_CAPSULE | Freq: Two times a day (BID) | ORAL | 0 refills | Status: DC

## 2020-11-17 NOTE — Progress Notes (Signed)

## 2020-11-26 ENCOUNTER — Telehealth: Payer: BC Managed Care – PPO | Admitting: Nurse Practitioner

## 2020-11-26 DIAGNOSIS — R399 Unspecified symptoms and signs involving the genitourinary system: Secondary | ICD-10-CM | POA: Diagnosis not present

## 2020-11-26 NOTE — Progress Notes (Signed)
Based on what you shared with me, I feel your condition warrants further evaluation and I recommend that you be seen for a face to face office visit. Because you were recently treated and your symptoms did not resolve, further evaluation. You may need a urine culture at this time to ensure you are being treated with the appropriate antibiotic for your symptoms.   NOTE: If you entered your credit card information for this eVisit, you will not be charged. You may see a "hold" on your card for the $35 but that hold will drop off and you will not have a charge processed.   If you are having a true medical emergency please call 911.      For an urgent face to face visit, [facility] has five urgent care centers for your convenience:     [facility] [place] [facility] [place] Get Driving Directions [phone] [address] [place], [place] [address] . 10 am - 6pm Monday - Friday    [facility]) Get Driving Directions [phone] [address] [place], [place] [address] . 10 am to 8 pm Monday-Friday . 12 pm to 8 pm [facility] Get Driving Directions [phone] [place], [place], [place] [address] . 8 am to 8 pm Monday-Friday . 9 am to 6 pm Saturday . 11 am to 6 pm Sunday     [facility] Get Driving Directions  [phone] [address], [place] [address] . 8 am to 8 pm Monday-Friday . 8 am to 4 pm [facility] at [place] Get Driving Directions [phone] [address] [place], [place] [address] . 12 pm to 6 pm Monday-Friday      Your e-visit answers were reviewed by a board certified advanced clinical practitioner to complete your personal care plan.  Thank you for using e-Visits.

## 2020-11-28 ENCOUNTER — Telehealth: Payer: Self-pay

## 2020-11-28 DIAGNOSIS — R300 Dysuria: Secondary | ICD-10-CM

## 2020-11-28 NOTE — Telephone Encounter (Signed)
Patient calling with UTI symptoms, virtual appointment scheduled tomorrow with Dr. Maudie Mercury patient wanting to know if she could stop urine sample by today to be checked for appointment tomorrow. If so could labs be placed? Please advise.

## 2020-11-28 NOTE — Telephone Encounter (Signed)
I placed order for point-of-care urine dipstick for tomorrow since she has virtual visit already set up.

## 2020-11-29 ENCOUNTER — Telehealth: Payer: BC Managed Care – PPO | Admitting: Family Medicine

## 2020-11-29 DIAGNOSIS — R300 Dysuria: Secondary | ICD-10-CM

## 2020-11-29 NOTE — Progress Notes (Signed)
Mallory Collins wanted an inperson visit for ongoing dysuria despite treatment with abx via evisit already. She denies fevers, NVD, resp symptoms, hematuria, flank pain.  Sent message to PCP office admin to assist with inperson visit instead of virtual visit.

## 2020-11-30 ENCOUNTER — Ambulatory Visit: Payer: BC Managed Care – PPO | Admitting: Family Medicine

## 2020-12-01 ENCOUNTER — Ambulatory Visit: Payer: BC Managed Care – PPO | Admitting: Family Medicine

## 2020-12-04 ENCOUNTER — Encounter: Payer: Self-pay | Admitting: Family Medicine

## 2020-12-05 MED ORDER — PROPRANOLOL HCL 10 MG PO TABS
10.0000 mg | ORAL_TABLET | ORAL | 0 refills | Status: DC | PRN
Start: 2020-12-05 — End: 2020-12-28

## 2020-12-28 ENCOUNTER — Other Ambulatory Visit: Payer: Self-pay | Admitting: Family Medicine

## 2020-12-31 ENCOUNTER — Other Ambulatory Visit: Payer: Self-pay | Admitting: Family Medicine

## 2021-01-03 MED ORDER — DULOXETINE HCL 60 MG PO CPEP
60.0000 mg | ORAL_CAPSULE | Freq: Every day | ORAL | 1 refills | Status: DC
Start: 2021-01-03 — End: 2021-09-20

## 2021-01-03 MED ORDER — PROPRANOLOL HCL 10 MG PO TABS
10.0000 mg | ORAL_TABLET | ORAL | 1 refills | Status: DC | PRN
Start: 2021-01-03 — End: 2021-07-17

## 2021-01-08 ENCOUNTER — Other Ambulatory Visit: Payer: Self-pay | Admitting: Family Medicine

## 2021-03-07 ENCOUNTER — Other Ambulatory Visit: Payer: Self-pay | Admitting: Adult Health

## 2021-03-07 DIAGNOSIS — F410 Panic disorder [episodic paroxysmal anxiety] without agoraphobia: Secondary | ICD-10-CM

## 2021-03-07 DIAGNOSIS — F4000 Agoraphobia, unspecified: Secondary | ICD-10-CM

## 2021-03-07 DIAGNOSIS — F411 Generalized anxiety disorder: Secondary | ICD-10-CM

## 2021-03-30 ENCOUNTER — Telehealth: Payer: BC Managed Care – PPO | Admitting: Emergency Medicine

## 2021-03-30 DIAGNOSIS — L03011 Cellulitis of right finger: Secondary | ICD-10-CM

## 2021-03-30 MED ORDER — DOXYCYCLINE HYCLATE 100 MG PO CAPS: 100.0000 mg | ORAL_CAPSULE | Freq: Two times a day (BID) | ORAL | 0 refills | Status: DC

## 2021-03-30 NOTE — Patient Instructions (Signed)
This looks like a Paronychia, or an infection along side the nail.  Sometimes these require incision and drainage.  If not better in 36 hours, please be seen in person.  You should also soak your thumb in warm water.  Sometimes you can drain these on your own at home by applying pressure next to the nail.

## 2021-03-30 NOTE — Progress Notes (Signed)
[name], markgraf are scheduled for a virtual visit with your provider today.    Just as we do with appointments in the office, we must obtain your consent to participate.  Your consent will be active for this visit and any virtual visit you may have with one of our providers in the next 365 days.    If you have a MyChart account, I can also send a copy of this consent to you electronically.  All virtual visits are billed to your insurance company just like a traditional visit in the office.  As this is a virtual visit, video technology does not allow for your provider to perform a traditional examination.  This may limit your provider's ability to fully assess your condition.  If your provider identifies any concerns that need to be evaluated in person or the need to arrange testing such as labs, EKG, etc, we will make arrangements to do so.    Although advances in technology are sophisticated, we cannot ensure that it will always work on either your end or our end.  If the connection with a video visit is poor, we may have to switch to a telephone visit.  With either a video or telephone visit, we are not always able to ensure that we have a secure connection.   I need to obtain your verbal consent now.   Are you willing to proceed with your visit today?   [name] has provided verbal consent on 03/30/2021 for a virtual visit (video or telephone).   [name], PA-C 03/30/2021  12:49 PM     Virtual Visit via Video   I connected with patient on 03/30/21 at  1:00 PM EDT by a video enabled telemedicine application and verified that I am speaking with the correct person using two identifiers.  Location patient: Home Location provider: [place] participating in the virtual visit: Patient, Provider  I discussed the limitations of evaluation and management by telemedicine and the availability of in person appointments. The patient expressed understanding and  agreed to proceed.  Subjective:   HPI:   Patient presents via [facility] today fingernail pain that started yesterday.  Right thumb nail pain and swelling on the side.  Denies any history of trauma.  Denies biting nails.  Hasn't had something like this before.  Reports worsening of symptoms today with mild swelling around the tip of the thumb.  ROS:   See pertinent positives and negatives per HPI.  Patient Active Problem List   Diagnosis Date Noted  . Asthma, mild intermittent 06/07/2020  . Hypothyroidism 06/07/2020  . Fibromyalgia 06/07/2020  . Tachycardia 10/11/2017  . SVT (supraventricular tachycardia) ([facility]) 10/11/2017  . Asthma 04/04/2009  . Chronic fatigue and immune dysfunction syndrome ([facility]) 01/04/1991    Social History   Tobacco Use  . Smoking status: Never Smoker  . Smokeless tobacco: Never Used  Substance Use Topics  . Alcohol use: Never    Current Outpatient Medications:  .  albuterol (PROVENTIL HFA;VENTOLIN HFA) 108 (90 Base) MCG/ACT inhaler, Inhale 2 puffs into the lungs every 6 (six) hours as needed for wheezing or shortness of breath., Disp: , Rfl:  .  cephALEXin (KEFLEX) 500 MG capsule, Take 1 capsule (500 mg total) by mouth 2 (two) times daily., Disp: 14 capsule, Rfl: 0 .  cholecalciferol (VITAMIN D) 1000 units tablet, Take 1,000 Units by mouth daily., Disp: , Rfl:  .  diltiazem (CARDIZEM) 30 MG tablet, Take 1 tablet (30  mg total) by mouth 2 (two) times daily., Disp: 60 tablet, Rfl: 6 .  DULoxetine (CYMBALTA) 60 MG capsule, Take 1 capsule (60 mg total) by mouth daily., Disp: 90 capsule, Rfl: 1 .  levothyroxine (SYNTHROID) 137 MCG tablet, TAKE 1 TABLET (137 MCG TOTAL) BY MOUTH EVERY OTHER DAY. ALTERNATING W/ 150 MCG TABLETS, Disp: 30 tablet, Rfl: 2 .  LORazepam (ATIVAN) 0.5 MG tablet, TAKE 1 TABLET (0.5 MG TOTAL) BY MOUTH DAILY., Disp: 30 tablet, Rfl: 2 .  Multiple Vitamin (MULTIVITAMIN) tablet, Take 1 tablet by mouth daily., Disp: , Rfl:  .  ondansetron  (ZOFRAN) 4 MG tablet, Take 1 tablet (4 mg total) by mouth every 8 (eight) hours as needed for nausea or vomiting., Disp: 20 tablet, Rfl: 0 .  propranolol (INDERAL) 10 MG tablet, Take 1 tablet (10 mg total) by mouth as needed., Disp: 90 tablet, Rfl: 1  No Known Allergies  Objective:   There were no vitals taken for this visit.  Patient is well-developed, well-nourished in no acute distress.  Resting comfortably at home.  Head is normocephalic, atraumatic.  No labored breathing.  Speech is clear and coherent with logical content.  Patient is alert and oriented at baseline.  Mild erythema and swelling of the distal right 1st digit along the radial aspect of the nail  Assessment and Plan:   1. Paronychia.  Doxycycline 100mg  BID x 7 days.  Warm soaks/compresses.  If not better in 36 hours, or if significantly worse will need in-person exam and probable I&D.   [name], PA-C 03/30/2021

## 2021-04-10 ENCOUNTER — Telehealth (INDEPENDENT_AMBULATORY_CARE_PROVIDER_SITE_OTHER): Payer: BC Managed Care – PPO | Admitting: Adult Health

## 2021-04-10 ENCOUNTER — Encounter: Payer: Self-pay | Admitting: Adult Health

## 2021-04-10 DIAGNOSIS — F411 Generalized anxiety disorder: Secondary | ICD-10-CM

## 2021-04-10 DIAGNOSIS — F331 Major depressive disorder, recurrent, moderate: Secondary | ICD-10-CM

## 2021-04-10 NOTE — Progress Notes (Signed)
Mallory Collins [id number] November 10, 1976 45 y.o.  Virtual Visit via Telephone Note  I connected with pt on 04/10/21 at  3:20 PM EDT by telephone and verified that I am speaking with the correct person using two identifiers.   I discussed the limitations, risks, security and privacy concerns of performing an evaluation and management service by telephone and the availability of in person appointments. I also discussed with the patient that there may be a patient responsible charge related to this service. The patient expressed understanding and agreed to proceed.   I discussed the assessment and treatment plan with the patient. The patient was provided an opportunity to ask questions and all were answered. The patient agreed with the plan and demonstrated an understanding of the instructions.   The patient was advised to call back or seek an in-person evaluation if the symptoms worsen or if the condition fails to improve as anticipated.  I provided 10 minutes of non-face-to-face time during this encounter.  The patient was located at home.  The provider was located at [place].   Aloha Gell, NP   Subjective:   Patient ID:  Mallory Collins is a 45 y.o. (DOB 06/20/1976) female.  Chief Complaint: No chief complaint on file.   HPI Mallory Collins presents for follow-up of GAD, MDD, panic attacks, agoraphobia.   Describes mood today as "ok". Denies tearfulness. Mood symptoms - reports depression and irritability - situationally driven. Reports anxiety - "every now and then". Denies panic attacks - "maybe once". Has the desire to get out more. Stating "I'm doing alright". Using Lorazepam as needed - "not taking every day". Recently lost one of her 3 dogs. Stable interest and motivation. Taking medications as prescribed.  Energy levels vary. Active, does not have a regular exercise routine.  Enjoys some usual interests and activities. Married. Lives with husband and  mother - 2 dogs. Spending time with family. Appetite adequate. Weight gain - 142 pounds.  Sleeps well most nights. Averages 9 - 10 hours. Focus and concentration stable. Completing tasks. Managing aspects of household. Unable to work - Fibromyalgia. Denies SI or HI.  Denies AH or VH.  Previous medication trials: Clonazepam - too sedating  Review of Systems:  Review of Systems  Musculoskeletal: Negative for gait problem.  Neurological: Negative for tremors.  Psychiatric/Behavioral:       Please refer to HPI    Medications: I have reviewed the patient's current medications.  Current Outpatient Medications  Medication Sig Dispense Refill  . albuterol (PROVENTIL HFA;VENTOLIN HFA) 108 (90 Base) MCG/ACT inhaler Inhale 2 puffs into the lungs every 6 (six) hours as needed for wheezing or shortness of breath.    . cephALEXin (KEFLEX) 500 MG capsule Take 1 capsule (500 mg total) by mouth 2 (two) times daily. 14 capsule 0  . cholecalciferol (VITAMIN D) 1000 units tablet Take 1,000 Units by mouth daily.    Marland Kitchen diltiazem (CARDIZEM) 30 MG tablet Take 1 tablet (30 mg total) by mouth 2 (two) times daily. 60 tablet 6  . doxycycline (VIBRAMYCIN) 100 MG capsule Take 1 capsule (100 mg total) by mouth 2 (two) times daily. 20 capsule 0  . DULoxetine (CYMBALTA) 60 MG capsule Take 1 capsule (60 mg total) by mouth daily. 90 capsule 1  . levothyroxine (SYNTHROID) 137 MCG tablet TAKE 1 TABLET (137 MCG TOTAL) BY MOUTH EVERY OTHER DAY. ALTERNATING W/ 150 MCG TABLETS 30 tablet 2  . LORazepam (ATIVAN) 0.5 MG tablet TAKE 1 TABLET (0.5  MG TOTAL) BY MOUTH DAILY. 30 tablet 2  . Multiple Vitamin (MULTIVITAMIN) tablet Take 1 tablet by mouth daily.    . ondansetron (ZOFRAN) 4 MG tablet Take 1 tablet (4 mg total) by mouth every 8 (eight) hours as needed for nausea or vomiting. 20 tablet 0  . propranolol (INDERAL) 10 MG tablet Take 1 tablet (10 mg total) by mouth as needed. 90 tablet 1   No current facility-administered  medications for this visit.    Medication Side Effects: None  Allergies: No Known Allergies  Past Medical History:  Diagnosis Date  . Agoraphobia   . Asthma   . Chronic fatigue   . GAD (generalized anxiety disorder)   . Hypothyroidism   . MDD (major depressive disorder)   . Panic attacks     Family History  Problem Relation Age of Onset  . Hypertension Mother   . Cancer Mother        breast  . CAD Mother 88       CABG, stents  . Heart disease Paternal Grandmother     Social History   Socioeconomic History  . Marital status: Married    Spouse name: Not on file  . Number of children: Not on file  . Years of education: Not on file  . Highest education level: Not on file  Occupational History  . Not on file  Tobacco Use  . Smoking status: Never Smoker  . Smokeless tobacco: Never Used  Substance and Sexual Activity  . Alcohol use: Never  . Drug use: Never  . Sexual activity: Not on file  Other Topics Concern  . Not on file  Social History Narrative  . Not on file   Social Determinants of Health   Financial Resource Strain: Not on file  Food Insecurity: Not on file  Transportation Needs: Not on file  Physical Activity: Not on file  Stress: Not on file  Social Connections: Not on file  Intimate Partner Violence: Not on file    Past Medical History, Surgical history, Social history, and Family history were reviewed and updated as appropriate.   Please see review of systems for further details on the patient's review from today.   Objective:   Physical Exam:  There were no vitals taken for this visit.  Physical Exam Constitutional:      General: She is not in acute distress. Musculoskeletal:        General: No deformity.  Neurological:     Mental Status: She is alert and oriented to person, place, and time.     Coordination: Coordination normal.  Psychiatric:        Attention and Perception: Attention and perception normal. She does not perceive  auditory or visual hallucinations.        Mood and Affect: Mood normal. Mood is not anxious or depressed. Affect is not labile, blunt, angry or inappropriate.        Speech: Speech normal.        Behavior: Behavior normal.        Thought Content: Thought content normal. Thought content is not paranoid or delusional. Thought content does not include homicidal or suicidal ideation. Thought content does not include homicidal or suicidal plan.        Cognition and Memory: Cognition and memory normal.        Judgment: Judgment normal.     Comments: Insight intact     Lab Review:     Component Value Date/Time   NA  146 (H) 05/22/2010 0100   K 3.2 (L) 05/22/2010 0100   CL 103 05/22/2010 0100   CO2 28 05/22/2010 0100   GLUCOSE 115 (H) 05/22/2010 0100   BUN 8 05/22/2010 0100   CREATININE 0.7 05/22/2010 0100   CALCIUM 9.2 05/22/2010 0100   GFRNONAA >60 05/22/2010 0100   GFRAA  05/22/2010 0100    >60        The eGFR has been calculated using the MDRD equation. This calculation has not been validated in all clinical situations. eGFR's persistently <60 mL/min signify possible Chronic Kidney Disease.       Component Value Date/Time   WBC 15.1 (H) 05/22/2010 0100   RBC 4.64 05/22/2010 0100   HGB 13.4 05/22/2010 0100   HCT 40.5 05/22/2010 0100   PLT 309 05/22/2010 0100   MCV 87.4 05/22/2010 0100   MCHC 33.1 05/22/2010 0100   RDW 13.0 05/22/2010 0100   LYMPHSABS 2.2 05/22/2010 0100   MONOABS 1.1 (H) 05/22/2010 0100   EOSABS 0.0 05/22/2010 0100   BASOSABS 0.1 05/22/2010 0100    No results found for: POCLITH, LITHIUM   No results found for: PHENYTOIN, PHENOBARB, VALPROATE, CBMZ   .res Assessment: Plan:    Plan:  PDMP reviewed  1. Ativan 0.76m daily as needed for anxiety/agoraphobia  Taking Cymbalta - 669mdaily  RTC 6 months - will call in 3 months for a refill  Patient advised to contact office with any questions, adverse effects, or acute worsening in signs and  symptoms.  Discussed potential benefits, risk, and side effects of benzodiazepines to include potential risk of tolerance and dependence, as well as possible drowsiness.  Advised patient not to drive if experiencing drowsiness and to take lowest possible effective dose to minimize risk of dependence and tolerance.  Diagnoses and all orders for this visit:  Generalized anxiety disorder  Major depressive disorder, recurrent episode, moderate ([place]   Please see After Visit Summary for patient specific instructions.  No future appointments.  No orders of the defined types were placed in this encounter.     -------------------------------

## 2021-04-28 ENCOUNTER — Encounter: Payer: Self-pay | Admitting: Family Medicine

## 2021-05-01 MED ORDER — ALBUTEROL SULFATE HFA 108 (90 BASE) MCG/ACT IN AERS: 2.0000 | INHALATION_SPRAY | Freq: Four times a day (QID) | RESPIRATORY_TRACT | 1 refills | Status: DC | PRN

## 2021-05-01 NOTE — Telephone Encounter (Signed)
You have not prescribed this prescription before. Ok to fill?

## 2021-05-08 ENCOUNTER — Other Ambulatory Visit: Payer: Self-pay | Admitting: Family Medicine

## 2021-06-06 ENCOUNTER — Telehealth: Payer: 59 | Admitting: Nurse Practitioner

## 2021-06-06 DIAGNOSIS — R112 Nausea with vomiting, unspecified: Secondary | ICD-10-CM

## 2021-06-06 MED ORDER — ONDANSETRON 4 MG PO TBDP: 4.0000 mg | ORAL_TABLET | Freq: Three times a day (TID) | ORAL | 0 refills | Status: AC | PRN

## 2021-06-06 NOTE — Progress Notes (Signed)
I spent approximately 7 minutes reviewing patient's chart and history and coordinating care

## 2021-06-06 NOTE — Progress Notes (Signed)
We are sorry that you are not feeling well. Here is how we plan to help!  Based on what you have shared with me it looks like you have a Virus that is irritating your GI tract.  Vomiting is the forceful emptying of a portion of the stomach's content through the mouth.  Although nausea and vomiting can make you feel miserable, it's important to remember that these are not diseases, but rather symptoms of an underlying illness.  When we treat short term symptoms, we always caution that any symptoms that persist should be fully evaluated in a medical office.  I have prescribed a medication that will help alleviate your symptoms and allow you to stay hydrated:  Zofran 4 mg 1 tablet every 8 hours as needed for nausea and vomiting  HOME CARE:  Drink clear liquids.  This is very important! Dehydration (the lack of fluid) can lead to a serious complication.  Start off with 1 tablespoon every 5 minutes for 8 hours.  You may begin eating bland foods after 8 hours without vomiting.  Start with saltine crackers, white bread, rice, mashed potatoes, applesauce.  After 48 hours on a bland diet, you may resume a normal diet.  Try to go to sleep.  Sleep often empties the stomach and relieves the need to vomit.  GET HELP RIGHT AWAY IF:   Your symptoms do not improve or worsen within 2 days after treatment.  You have a fever for over 3 days.  You cannot keep down fluids after trying the medication.  MAKE SURE YOU:   Understand these instructions.  Will watch your condition.  Will get help right away if you are not doing well or get worse.   Thank you for choosing an e-visit. Your e-visit answers were reviewed by a board certified advanced clinical practitioner to complete your personal care plan. Depending upon the condition, your plan could have included both over the counter or prescription medications. Please review your pharmacy choice. Be sure that the pharmacy you have chosen is open so  that you can pick up your prescription now.  If there is a problem you may message your provider in [place] to have the prescription routed to another pharmacy. Your safety is important to [place]. If you have drug allergies check your prescription carefully.  For the next 24 hours, you can use MyChart to ask questions about today's visit, request a non-urgent call back, or ask for a work or school excuse from your e-visit provider. You will get an e-mail in the next two days asking about your experience. I hope that your e-visit has been valuable and will speed your recovery. Meds ordered this encounter  Medications  . ondansetron (ZOFRAN ODT) 4 MG disintegrating tablet    Sig: Take 1 tablet (4 mg total) by mouth every 8 (eight) hours as needed for up to 7 days for nausea or vomiting.    Dispense:  20 tablet    Refill:  0

## 2021-07-05 ENCOUNTER — Other Ambulatory Visit: Payer: Self-pay | Admitting: Family Medicine

## 2021-07-07 ENCOUNTER — Other Ambulatory Visit: Payer: Self-pay | Admitting: Family Medicine

## 2021-07-07 ENCOUNTER — Encounter: Payer: Self-pay | Admitting: Family Medicine

## 2021-07-07 NOTE — Telephone Encounter (Signed)
Please advise. I only see the 137mg  on the current med list. Should the patient be on both the 137mg  and the 150mg  of levothyroxine?

## 2021-07-10 ENCOUNTER — Other Ambulatory Visit: Payer: Self-pay | Admitting: Family Medicine

## 2021-07-10 MED ORDER — LEVOTHYROXINE SODIUM 137 MCG PO TABS: 137.0000 ug | ORAL_TABLET | ORAL | 0 refills | Status: DC

## 2021-07-17 ENCOUNTER — Other Ambulatory Visit: Payer: Self-pay | Admitting: Family Medicine

## 2021-08-28 ENCOUNTER — Ambulatory Visit: Payer: 59 | Admitting: Family Medicine

## 2021-08-29 ENCOUNTER — Ambulatory Visit: Payer: 59 | Admitting: Family Medicine

## 2021-09-06 ENCOUNTER — Other Ambulatory Visit: Payer: Self-pay

## 2021-09-06 ENCOUNTER — Ambulatory Visit (INDEPENDENT_AMBULATORY_CARE_PROVIDER_SITE_OTHER): Payer: 59 | Admitting: Family Medicine

## 2021-09-06 VITALS — BP 118/70 | HR 102 | Temp 99.1°F | Wt 144.8 lb

## 2021-09-06 DIAGNOSIS — E039 Hypothyroidism, unspecified: Secondary | ICD-10-CM

## 2021-09-06 DIAGNOSIS — J301 Allergic rhinitis due to pollen: Secondary | ICD-10-CM | POA: Diagnosis not present

## 2021-09-06 LAB — TSH: TSH: 1.08 u[IU]/mL (ref 0.35–5.50)

## 2021-09-06 NOTE — Progress Notes (Signed)
Established Patient Office Visit  Subjective:  Patient ID: Mallory Collins, female    DOB: [birth year]  Age: 45 y.o. MRN: [id number]  CC: No chief complaint on file.   HPI Mallory Collins presents for the following items  She has history of hypothyroidism and is on replacement.  She is overdue for labs and requesting follow-up labs today.  She states she takes her medication consistently.  No overt symptoms of hypothyroidism.  Second issue is nasal congestion and watery eyes symptoms.  She thinks this is allergy related.  Longstanding history of allergies.  Sometimes takes Benadryl but this does cause sedation.  Not using any other antihistamines or nasal steroids.  No recent purulent nasal secretions.  No fever.  Past Medical History:  Diagnosis Date   Agoraphobia    Asthma    Chronic fatigue    GAD (generalized anxiety disorder)    Hypothyroidism    MDD (major depressive disorder)    Panic attacks     Past Surgical History:  Procedure Laterality Date   WISDOM TOOTH EXTRACTION      Family History  Problem Relation Age of Onset   Hypertension Mother    Cancer Mother        breast   CAD Mother 5       CABG, stents   Heart disease Paternal Grandmother     Social History   Socioeconomic History   Marital status: Married    Spouse name: Not on file   Number of children: Not on file   Years of education: Not on file   Highest education level: Not on file  Occupational History   Not on file  Tobacco Use   Smoking status: Never   Smokeless tobacco: Never  Substance and Sexual Activity   Alcohol use: Never   Drug use: Never   Sexual activity: Not on file  Other Topics Concern   Not on file  Social History Narrative   Not on file   Social Determinants of Health   Financial Resource Strain: Not on file  Food Insecurity: Not on file  Transportation Needs: Not on file  Physical Activity: Not on file  Stress: Not on file  Social Connections: Not on  file  Intimate Partner Violence: Not on file    Outpatient Medications Prior to Visit  Medication Sig Dispense Refill   albuterol (VENTOLIN HFA) 108 (90 Base) MCG/ACT inhaler Inhale 2 puffs into the lungs every 6 (six) hours as needed for wheezing or shortness of breath. 1 each 1   cholecalciferol (VITAMIN D) 1000 units tablet Take 1,000 Units by mouth daily.     diltiazem (CARDIZEM) 30 MG tablet Take 1 tablet (30 mg total) by mouth 2 (two) times daily. 60 tablet 6   DULoxetine (CYMBALTA) 60 MG capsule Take 1 capsule (60 mg total) by mouth daily. 90 capsule 1   levothyroxine (SYNTHROID) 137 MCG tablet Take one tablet by mouth daily. 90 tablet 0   LORazepam (ATIVAN) 0.5 MG tablet TAKE 1 TABLET (0.5 MG TOTAL) BY MOUTH DAILY. 30 tablet 2   Multiple Vitamin (MULTIVITAMIN) tablet Take 1 tablet by mouth daily.     ondansetron (ZOFRAN) 4 MG tablet Take 1 tablet (4 mg total) by mouth every 8 (eight) hours as needed for nausea or vomiting. 20 tablet 0   propranolol (INDERAL) 10 MG tablet TAKE 1 TABLET BY MOUTH AS NEEDED. 90 tablet 0   cephALEXin (KEFLEX) 500 MG capsule Take 1 capsule (500 mg  total) by mouth 2 (two) times daily. 14 capsule 0   doxycycline (VIBRAMYCIN) 100 MG capsule Take 1 capsule (100 mg total) by mouth 2 (two) times daily. 20 capsule 0   No facility-administered medications prior to visit.    No Known Allergies  ROS Review of Systems  Constitutional:  Negative for chills and fever.  HENT:  Positive for congestion and postnasal drip. Negative for nosebleeds, sinus pressure and sinus pain.   Respiratory:  Negative for cough and shortness of breath.   Endocrine: Negative for cold intolerance.     Objective:    Physical Exam Vitals reviewed.  Constitutional:      Appearance: Normal appearance.  HENT:     Right Ear: Tympanic membrane normal.     Left Ear: Tympanic membrane normal.  Cardiovascular:     Rate and Rhythm: Normal rate and regular rhythm.  Pulmonary:      Effort: Pulmonary effort is normal.     Breath sounds: Normal breath sounds. No wheezing or rales.  Musculoskeletal:     Cervical back: Neck supple.  Lymphadenopathy:     Cervical: No cervical adenopathy.  Neurological:     Mental Status: She is alert.    BP 118/70 (BP Location: Left Arm, Patient Position: Sitting, Cuff Size: Normal)   Pulse (!) 102   Temp 99.1 F (37.3 C) (Oral)   Wt 144 lb 12.8 oz (65.7 kg)   SpO2 95%   BMI 24.10 kg/m  Wt Readings from Last 3 Encounters:  09/06/21 144 lb 12.8 oz (65.7 kg)  06/07/20 140 lb 1.6 oz (63.5 kg)  07/01/19 140 lb (63.5 kg)     Health Maintenance Due  Topic Date Due   HIV Screening  Never done   Hepatitis C Screening  Never done   TETANUS/TDAP  Never done   PAP SMEAR-Modifier  Never done   COLONOSCOPY (Pts 45-85yr Insurance coverage will need to be confirmed)  Never done   INFLUENZA VACCINE  Never done    There are no preventive care reminders to display for this patient.  No results found for: TSH Lab Results  Component Value Date   WBC 15.1 (H) 05/22/2010   HGB 13.4 05/22/2010   HCT 40.5 05/22/2010   MCV 87.4 05/22/2010   PLT 309 05/22/2010   Lab Results  Component Value Date   NA 146 (H) 05/22/2010   K 3.2 (L) 05/22/2010   CO2 28 05/22/2010   GLUCOSE 115 (H) 05/22/2010   BUN 8 05/22/2010   CREATININE 0.7 05/22/2010   CALCIUM 9.2 05/22/2010   No results found for: CHOL No results found for: HDL No results found for: LDLCALC No results found for: TRIG No results found for: CHOLHDL No results found for: HGBA1C    Assessment & Plan:   Problem List Items Addressed This Visit       Unprioritized   Hypothyroidism - Primary   Relevant Orders   TSH   Other Visit Diagnoses     Seasonal allergic rhinitis due to pollen         -Check TSH.  If normal range refill her thyroid medication for 1 year  -Recommend over-the-counter medications for allergies such as Flonase or Nasacort and also consider  nonsedating antihistamine such as Zyrtec, Allegra, or Claritin and be in touch if symptoms not controlled with the above  No orders of the defined types were placed in this encounter.   Follow-up: No follow-ups on file.    BCarolann Littler MD

## 2021-09-06 NOTE — Patient Instructions (Signed)
Consider OTC medications for allergies such as Flonase and oral anti-histamine such as XYzal, Zyrtec, Allegra, or Claritan.

## 2021-09-07 ENCOUNTER — Other Ambulatory Visit: Payer: Self-pay

## 2021-09-07 MED ORDER — LEVOTHYROXINE SODIUM 137 MCG PO TABS: ORAL_TABLET | ORAL | 3 refills | Status: DC

## 2021-09-20 ENCOUNTER — Other Ambulatory Visit: Payer: Self-pay | Admitting: Family Medicine

## 2021-10-07 ENCOUNTER — Other Ambulatory Visit: Payer: Self-pay | Admitting: Adult Health

## 2021-10-07 DIAGNOSIS — F411 Generalized anxiety disorder: Secondary | ICD-10-CM

## 2021-10-07 DIAGNOSIS — F410 Panic disorder [episodic paroxysmal anxiety] without agoraphobia: Secondary | ICD-10-CM

## 2021-10-07 DIAGNOSIS — F4000 Agoraphobia, unspecified: Secondary | ICD-10-CM

## 2021-10-09 NOTE — Telephone Encounter (Signed)
Appt scheduled 10/12

## 2021-10-09 NOTE — Telephone Encounter (Signed)
Please schedule an appt

## 2021-10-09 NOTE — Telephone Encounter (Signed)
Called and LM to schedule appt

## 2021-10-10 NOTE — Telephone Encounter (Signed)
Last filled 03/12/21 appt on 10/12

## 2021-10-11 ENCOUNTER — Other Ambulatory Visit: Payer: Self-pay

## 2021-10-11 ENCOUNTER — Ambulatory Visit (INDEPENDENT_AMBULATORY_CARE_PROVIDER_SITE_OTHER): Payer: Self-pay | Admitting: Adult Health

## 2021-10-11 ENCOUNTER — Encounter: Payer: Self-pay | Admitting: Adult Health

## 2021-10-11 DIAGNOSIS — F4000 Agoraphobia, unspecified: Secondary | ICD-10-CM

## 2021-10-11 DIAGNOSIS — F410 Panic disorder [episodic paroxysmal anxiety] without agoraphobia: Secondary | ICD-10-CM

## 2021-10-11 DIAGNOSIS — F411 Generalized anxiety disorder: Secondary | ICD-10-CM

## 2021-10-11 NOTE — Progress Notes (Signed)
Mallory Collins [id number] 29-May-1976 45 y.o.  Virtual Visit via Telephone Note  I connected with pt on 10/11/21 at  2:40 PM EDT by telephone and verified that I am speaking with the correct person using two identifiers.   I discussed the limitations, risks, security and privacy concerns of performing an evaluation and management service by telephone and the availability of in person appointments. I also discussed with the patient that there may be a patient responsible charge related to this service. The patient expressed understanding and agreed to proceed.   I discussed the assessment and treatment plan with the patient. The patient was provided an opportunity to ask questions and all were answered. The patient agreed with the plan and demonstrated an understanding of the instructions.   The patient was advised to call back or seek an in-person evaluation if the symptoms worsen or if the condition fails to improve as anticipated.  I provided 10 minutes of non-face-to-face time during this encounter.  The patient was located at home.  The provider was located at [place].   Aloha Gell, NP   Subjective:   Patient ID:  Mallory Collins is a 45 y.o. (DOB 27-Feb-1976) female.  Chief Complaint: No chief complaint on file.   HPI Mallory Collins presents for follow-up of GAD.  Describes mood today as "ok". Denies tearfulness. Mood symptoms - denies depression and irritability. Feels anxious at times. Denies panic attacks. Stating "I'm doing ok". Using Lorazepam as needed. Stable interest and motivation. Taking medications as prescribed.  Energy levels vary. Active, does not have a regular exercise routine.  Enjoys some usual interests and activities. Married. Lives with husband and mother - 2 dogs. Spending time with family. Appetite adequate. Weight gain - 3 pounds 142 to 145 pounds.  Sleeps well most nights. Averages 9 - 10 hours. Focus and concentration  stable. Completing tasks. Managing aspects of household. Unable to work - Fibromyalgia. Denies SI or HI.  Denies AH or VH.  Previous medication trials: Clonazepam - too sedating   Review of Systems:  Review of Systems  Musculoskeletal:  Negative for gait problem.  Neurological:  Negative for tremors.  Psychiatric/Behavioral:         Please refer to HPI   Medications: I have reviewed the patient's current medications.  Current Outpatient Medications  Medication Sig Dispense Refill   albuterol (VENTOLIN HFA) 108 (90 Base) MCG/ACT inhaler Inhale 2 puffs into the lungs every 6 (six) hours as needed for wheezing or shortness of breath. 1 each 1   cholecalciferol (VITAMIN D) 1000 units tablet Take 1,000 Units by mouth daily.     diltiazem (CARDIZEM) 30 MG tablet Take 1 tablet (30 mg total) by mouth 2 (two) times daily. 60 tablet 6   DULoxetine (CYMBALTA) 60 MG capsule TAKE 1 CAPSULE BY MOUTH EVERY DAY 90 capsule 0   levothyroxine (SYNTHROID) 137 MCG tablet Take one tablet by mouth daily. 90 tablet 3   LORazepam (ATIVAN) 0.5 MG tablet TAKE 1 TABLET BY MOUTH EVERY DAY 30 tablet 0   Multiple Vitamin (MULTIVITAMIN) tablet Take 1 tablet by mouth daily.     ondansetron (ZOFRAN) 4 MG tablet Take 1 tablet (4 mg total) by mouth every 8 (eight) hours as needed for nausea or vomiting. 20 tablet 0   propranolol (INDERAL) 10 MG tablet TAKE 1 TABLET BY MOUTH AS NEEDED. 90 tablet 0   No current facility-administered medications for this visit.    Medication Side Effects: None  Allergies: No Known Allergies  Past Medical History:  Diagnosis Date   Agoraphobia    Asthma    Chronic fatigue    GAD (generalized anxiety disorder)    Hypothyroidism    MDD (major depressive disorder)    Panic attacks     Family History  Problem Relation Age of Onset   Hypertension Mother    Cancer Mother        breast   CAD Mother 31       CABG, stents   Heart disease Paternal Grandmother     Social  History   Socioeconomic History   Marital status: Married    Spouse name: Not on file   Number of children: Not on file   Years of education: Not on file   Highest education level: Not on file  Occupational History   Not on file  Tobacco Use   Smoking status: Never   Smokeless tobacco: Never  Substance and Sexual Activity   Alcohol use: Never   Drug use: Never   Sexual activity: Not on file  Other Topics Concern   Not on file  Social History Narrative   Not on file   Social Determinants of Health   Financial Resource Strain: Not on file  Food Insecurity: Not on file  Transportation Needs: Not on file  Physical Activity: Not on file  Stress: Not on file  Social Connections: Not on file  Intimate Partner Violence: Not on file    Past Medical History, Surgical history, Social history, and Family history were reviewed and updated as appropriate.   Please see review of systems for further details on the patient's review from today.   Objective:   Physical Exam:  There were no vitals taken for this visit.  Physical Exam Constitutional:      General: She is not in acute distress. Musculoskeletal:        General: No deformity.  Neurological:     Mental Status: She is alert and oriented to person, place, and time.     Coordination: Coordination normal.  Psychiatric:        Attention and Perception: Attention and perception normal. She does not perceive auditory or visual hallucinations.        Mood and Affect: Mood normal. Mood is not anxious or depressed. Affect is not labile, blunt, angry or inappropriate.        Speech: Speech normal.        Behavior: Behavior normal.        Thought Content: Thought content normal. Thought content is not paranoid or delusional. Thought content does not include homicidal or suicidal ideation. Thought content does not include homicidal or suicidal plan.        Cognition and Memory: Cognition and memory normal.        Judgment:  Judgment normal.     Comments: Insight intact    Lab Review:     Component Value Date/Time   NA 146 (H) 05/22/2010 0100   K 3.2 (L) 05/22/2010 0100   CL 103 05/22/2010 0100   CO2 28 05/22/2010 0100   GLUCOSE 115 (H) 05/22/2010 0100   BUN 8 05/22/2010 0100   CREATININE 0.7 05/22/2010 0100   CALCIUM 9.2 05/22/2010 0100   GFRNONAA >60 05/22/2010 0100   GFRAA  05/22/2010 0100    >60        The eGFR has been calculated using the MDRD equation. This calculation has not been validated in all clinical situations.  eGFR's persistently <60 mL/min signify possible Chronic Kidney Disease.       Component Value Date/Time   WBC 15.1 (H) 05/22/2010 0100   RBC 4.64 05/22/2010 0100   HGB 13.4 05/22/2010 0100   HCT 40.5 05/22/2010 0100   PLT 309 05/22/2010 0100   MCV 87.4 05/22/2010 0100   MCHC 33.1 05/22/2010 0100   RDW 13.0 05/22/2010 0100   LYMPHSABS 2.2 05/22/2010 0100   MONOABS 1.1 (H) 05/22/2010 0100   EOSABS 0.0 05/22/2010 0100   BASOSABS 0.1 05/22/2010 0100    No results found for: POCLITH, LITHIUM   No results found for: PHENYTOIN, PHENOBARB, VALPROATE, CBMZ   .res Assessment: Plan:     Plan:  PDMP reviewed  1. Ativan 0.47m daily as needed for anxiety/agoraphobia  Taking Cymbalta - 641mdaily  RTC 6 months - will call in 3 months for a refill  Patient advised to contact office with any questions, adverse effects, or acute worsening in signs and symptoms.  Discussed potential benefits, risk, and side effects of benzodiazepines to include potential risk of tolerance and dependence, as well as possible drowsiness.  Advised patient not to drive if experiencing drowsiness and to take lowest possible effective dose to minimize risk of dependence and tolerance.   Diagnoses and all orders for this visit:  Generalized anxiety disorder   Please see After Visit Summary for patient specific instructions.  No future appointments.   No orders of the defined types  were placed in this encounter.     -------------------------------

## 2021-10-12 MED ORDER — LORAZEPAM 0.5 MG PO TABS: 0.5000 mg | ORAL_TABLET | Freq: Every day | ORAL | 0 refills | Status: DC

## 2021-10-24 ENCOUNTER — Other Ambulatory Visit: Payer: Self-pay | Admitting: Family Medicine

## 2021-11-01 ENCOUNTER — Encounter: Payer: Self-pay | Admitting: Family Medicine

## 2021-11-01 NOTE — Telephone Encounter (Signed)
Please advise 

## 2021-11-10 ENCOUNTER — Ambulatory Visit: Payer: Self-pay | Admitting: Adult Health

## 2021-11-14 ENCOUNTER — Encounter: Payer: Self-pay | Admitting: Adult Health

## 2021-11-14 ENCOUNTER — Telehealth (INDEPENDENT_AMBULATORY_CARE_PROVIDER_SITE_OTHER): Payer: 59 | Admitting: Adult Health

## 2021-11-14 DIAGNOSIS — F331 Major depressive disorder, recurrent, moderate: Secondary | ICD-10-CM

## 2021-11-14 DIAGNOSIS — F411 Generalized anxiety disorder: Secondary | ICD-10-CM | POA: Diagnosis not present

## 2021-11-14 DIAGNOSIS — F4000 Agoraphobia, unspecified: Secondary | ICD-10-CM | POA: Diagnosis not present

## 2021-11-14 DIAGNOSIS — F410 Panic disorder [episodic paroxysmal anxiety] without agoraphobia: Secondary | ICD-10-CM | POA: Diagnosis not present

## 2021-11-14 MED ORDER — DULOXETINE HCL 30 MG PO CPEP: 30.0000 mg | ORAL_CAPSULE | Freq: Every day | ORAL | 5 refills | Status: DC

## 2021-11-14 MED ORDER — LORAZEPAM 0.5 MG PO TABS: 0.5000 mg | ORAL_TABLET | Freq: Two times a day (BID) | ORAL | 2 refills | Status: DC | PRN

## 2021-11-14 NOTE — Progress Notes (Signed)
Mallory Collins [id number] [birth year] 45 y.o.  Virtual Visit via Video Note  I connected with pt @ on 11/14/21 at  3:40 PM EST by a video enabled telemedicine application and verified that I am speaking with the correct person using two identifiers.   I discussed the limitations of evaluation and management by telemedicine and the availability of in person appointments. The patient expressed understanding and agreed to proceed.  I discussed the assessment and treatment plan with the patient. The patient was provided an opportunity to ask questions and all were answered. The patient agreed with the plan and demonstrated an understanding of the instructions.   The patient was advised to call back or seek an in-person evaluation if the symptoms worsen or if the condition fails to improve as anticipated.  I provided 25 minutes of non-face-to-face time during this encounter.  The patient was located at home.  The provider was located at [place].   Mallory Gell, NP   Subjective:   Patient ID:  Mallory Collins is a 45 y.o. (DOB March 09, 1976) female.  Chief Complaint: No chief complaint on file.   HPI Mallory Collins presents for follow-up of GAD, MDD, panic attacks, agoraphobia.   Describes mood today as "ok". Denies tearfulness. Mood symptoms - denies depression and irritability. Increased anxiety since last visit. Stating "there hasn't been a day I haven't been anxious". Unable to identify a precipitant. Stating "nothing has really changed". Feels like it may just be "chemical". Reporting panic attacks - having to remove herself from noises - stimuli. Using Lorazepam more. Has received a jury summons and does not feel she can honor it. Unable to leave the house. Stating "it's all I can do to get through the day". Stable interest and motivation. Taking medications as prescribed.  Energy levels lower - feels exhausted. Active, does not have a regular exercise  routine.  Enjoys some usual interests and activities. Married. Lives with husband and mother - 2 dogs. Spending time with family. Appetite adequate. Weight stable - 142 pounds.  Sleeps well most nights. Averages 9 - 10 hours. Focus and concentration stable. Completing tasks. Managing aspects of household. Unable to work - Fibromyalgia. Denies SI or HI.  Denies AH or VH.  Previous medication trials: Clonazepam - too sedating   Review of Systems:  Review of Systems  Musculoskeletal:  Negative for gait problem.  Neurological:  Negative for tremors.  Psychiatric/Behavioral:         Please refer to HPI   Medications: I have reviewed the patient's current medications.  Current Outpatient Medications  Medication Sig Dispense Refill   DULoxetine (CYMBALTA) 30 MG capsule Take 1 capsule (30 mg total) by mouth daily. 30 capsule 5   albuterol (VENTOLIN HFA) 108 (90 Base) MCG/ACT inhaler Inhale 2 puffs into the lungs every 6 (six) hours as needed for wheezing or shortness of breath. 1 each 1   cholecalciferol (VITAMIN D) 1000 units tablet Take 1,000 Units by mouth daily.     diltiazem (CARDIZEM) 30 MG tablet Take 1 tablet (30 mg total) by mouth 2 (two) times daily. 60 tablet 6   DULoxetine (CYMBALTA) 60 MG capsule TAKE 1 CAPSULE BY MOUTH EVERY DAY 90 capsule 0   levothyroxine (SYNTHROID) 137 MCG tablet Take one tablet by mouth daily. 90 tablet 3   LORazepam (ATIVAN) 0.5 MG tablet Take 1 tablet (0.5 mg total) by mouth 2 (two) times daily as needed for anxiety. 60 tablet 2   Multiple Vitamin (  MULTIVITAMIN) tablet Take 1 tablet by mouth daily.     ondansetron (ZOFRAN) 4 MG tablet Take 1 tablet (4 mg total) by mouth every 8 (eight) hours as needed for nausea or vomiting. 20 tablet 0   propranolol (INDERAL) 10 MG tablet TAKE 1 TABLET BY MOUTH EVERY DAY AS NEEDED 90 tablet 0   No current facility-administered medications for this visit.    Medication Side Effects: None  Allergies: No Known  Allergies  Past Medical History:  Diagnosis Date   Agoraphobia    Asthma    Chronic fatigue    GAD (generalized anxiety disorder)    Hypothyroidism    MDD (major depressive disorder)    Panic attacks     Family History  Problem Relation Age of Onset   Hypertension Mother    Cancer Mother        breast   CAD Mother 39       CABG, stents   Heart disease Paternal Grandmother     Social History   Socioeconomic History   Marital status: Married    Spouse name: Not on file   Number of children: Not on file   Years of education: Not on file   Highest education level: Not on file  Occupational History   Not on file  Tobacco Use   Smoking status: Never   Smokeless tobacco: Never  Substance and Sexual Activity   Alcohol use: Never   Drug use: Never   Sexual activity: Not on file  Other Topics Concern   Not on file  Social History Narrative   Not on file   Social Determinants of Health   Financial Resource Strain: Not on file  Food Insecurity: Not on file  Transportation Needs: Not on file  Physical Activity: Not on file  Stress: Not on file  Social Connections: Not on file  Intimate Partner Violence: Not on file    Past Medical History, Surgical history, Social history, and Family history were reviewed and updated as appropriate.   Please see review of systems for further details on the patient's review from today.   Objective:   Physical Exam:  There were no vitals taken for this visit.  Physical Exam Constitutional:      General: She is not in acute distress. Musculoskeletal:        General: No deformity.  Neurological:     Mental Status: She is alert and oriented to person, place, and time.     Coordination: Coordination normal.  Psychiatric:        Attention and Perception: Attention and perception normal. She does not perceive auditory or visual hallucinations.        Mood and Affect: Mood normal. Mood is not anxious or depressed. Affect is not  labile, blunt, angry or inappropriate.        Speech: Speech normal.        Behavior: Behavior normal.        Thought Content: Thought content normal. Thought content is not paranoid or delusional. Thought content does not include homicidal or suicidal ideation. Thought content does not include homicidal or suicidal plan.        Cognition and Memory: Cognition and memory normal.        Judgment: Judgment normal.     Comments: Insight intact    Lab Review:     Component Value Date/Time   NA 146 (H) 05/22/2010 0100   K 3.2 (L) 05/22/2010 0100   CL 103 05/22/2010  0100   CO2 28 05/22/2010 0100   GLUCOSE 115 (H) 05/22/2010 0100   BUN 8 05/22/2010 0100   CREATININE 0.7 05/22/2010 0100   CALCIUM 9.2 05/22/2010 0100   GFRNONAA >60 05/22/2010 0100   GFRAA  05/22/2010 0100    >60        The eGFR has been calculated using the MDRD equation. This calculation has not been validated in all clinical situations. eGFR's persistently <60 mL/min signify possible Chronic Kidney Disease.       Component Value Date/Time   WBC 15.1 (H) 05/22/2010 0100   RBC 4.64 05/22/2010 0100   HGB 13.4 05/22/2010 0100   HCT 40.5 05/22/2010 0100   PLT 309 05/22/2010 0100   MCV 87.4 05/22/2010 0100   MCHC 33.1 05/22/2010 0100   RDW 13.0 05/22/2010 0100   LYMPHSABS 2.2 05/22/2010 0100   MONOABS 1.1 (H) 05/22/2010 0100   EOSABS 0.0 05/22/2010 0100   BASOSABS 0.1 05/22/2010 0100    No results found for: POCLITH, LITHIUM   No results found for: PHENYTOIN, PHENOBARB, VALPROATE, CBMZ   .res Assessment: Plan:    Plan:  PDMP reviewed  1. Ativan 0.58m daily to BID as needed for anxiety/agoraphobia 2. Add Cymbalta 379mdaily  Taking Cymbalta - 6052maily for fibromyalgia  RTC 3 months   Patient advised to contact office with any questions, adverse effects, or acute worsening in signs and symptoms.  Discussed potential benefits, risk, and side effects of benzodiazepines to include potential risk  of tolerance and dependence, as well as possible drowsiness.  Advised patient not to drive if experiencing drowsiness and to take lowest possible effective dose to minimize risk of dependence and tolerance.  Diagnoses and all orders for this visit:  Generalized anxiety disorder -     DULoxetine (CYMBALTA) 30 MG capsule; Take 1 capsule (30 mg total) by mouth daily. -     LORazepam (ATIVAN) 0.5 MG tablet; Take 1 tablet (0.5 mg total) by mouth 2 (two) times daily as needed for anxiety.  Major depressive disorder, recurrent episode, moderate (HCC) -     DULoxetine (CYMBALTA) 30 MG capsule; Take 1 capsule (30 mg total) by mouth daily.  Panic attacks -     LORazepam (ATIVAN) 0.5 MG tablet; Take 1 tablet (0.5 mg total) by mouth 2 (two) times daily as needed for anxiety.  Agoraphobia -     LORazepam (ATIVAN) 0.5 MG tablet; Take 1 tablet (0.5 mg total) by mouth 2 (two) times daily as needed for anxiety.    Please see After Visit Summary for patient specific instructions.  No future appointments.  No orders of the defined types were placed in this encounter.     -------------------------------

## 2021-11-16 ENCOUNTER — Telehealth: Payer: Self-pay | Admitting: Adult Health

## 2021-11-16 NOTE — Telephone Encounter (Signed)
Please give to [place].

## 2021-11-16 NOTE — Telephone Encounter (Signed)
Received [employer]. Placed in [place] box 11/17

## 2021-11-16 NOTE — Telephone Encounter (Signed)
Ok

## 2021-12-07 ENCOUNTER — Other Ambulatory Visit: Payer: Self-pay | Admitting: Adult Health

## 2021-12-07 DIAGNOSIS — F331 Major depressive disorder, recurrent, moderate: Secondary | ICD-10-CM

## 2021-12-07 DIAGNOSIS — F411 Generalized anxiety disorder: Secondary | ICD-10-CM

## 2021-12-18 ENCOUNTER — Other Ambulatory Visit: Payer: Self-pay | Admitting: Family Medicine

## 2022-01-22 ENCOUNTER — Other Ambulatory Visit: Payer: Self-pay | Admitting: Family Medicine

## 2022-01-23 ENCOUNTER — Telehealth: Payer: 59 | Admitting: Physician Assistant

## 2022-01-23 DIAGNOSIS — M5432 Sciatica, left side: Secondary | ICD-10-CM | POA: Diagnosis not present

## 2022-01-23 MED ORDER — NAPROXEN 500 MG PO TABS: 500.0000 mg | ORAL_TABLET | Freq: Two times a day (BID) | ORAL | 0 refills | Status: DC

## 2022-01-23 MED ORDER — CYCLOBENZAPRINE HCL 10 MG PO TABS: 10.0000 mg | ORAL_TABLET | Freq: Three times a day (TID) | ORAL | 0 refills | Status: DC | PRN

## 2022-01-23 NOTE — Progress Notes (Signed)

## 2022-02-10 ENCOUNTER — Other Ambulatory Visit: Payer: Self-pay | Admitting: Family Medicine

## 2022-03-09 ENCOUNTER — Other Ambulatory Visit: Payer: Self-pay | Admitting: Family Medicine

## 2022-03-12 NOTE — Telephone Encounter (Signed)
Last OV- 09/06/2021 ?Last refill- 06/20/20--20 tabs, 0 refills ? ?No future OV scheduled.   Can this patient receive a refill? ?

## 2022-03-21 ENCOUNTER — Telehealth: Payer: 59 | Admitting: Family

## 2022-03-21 DIAGNOSIS — J019 Acute sinusitis, unspecified: Secondary | ICD-10-CM | POA: Diagnosis not present

## 2022-03-21 MED ORDER — AMOXICILLIN-POT CLAVULANATE 875-125 MG PO TABS: 1.0000 | ORAL_TABLET | Freq: Two times a day (BID) | ORAL | 0 refills | Status: DC

## 2022-03-21 NOTE — Progress Notes (Signed)

## 2022-04-03 ENCOUNTER — Encounter: Payer: Self-pay | Admitting: Family Medicine

## 2022-04-03 ENCOUNTER — Telehealth: Payer: 59

## 2022-04-04 ENCOUNTER — Ambulatory Visit (INDEPENDENT_AMBULATORY_CARE_PROVIDER_SITE_OTHER): Payer: 59 | Admitting: Family Medicine

## 2022-04-04 ENCOUNTER — Encounter: Payer: Self-pay | Admitting: Family Medicine

## 2022-04-04 VITALS — BP 134/86 | HR 126 | Temp 97.5°F | Ht 65.0 in | Wt 157.1 lb

## 2022-04-04 DIAGNOSIS — R059 Cough, unspecified: Secondary | ICD-10-CM | POA: Diagnosis not present

## 2022-04-04 DIAGNOSIS — R053 Chronic cough: Secondary | ICD-10-CM

## 2022-04-04 DIAGNOSIS — U099 Post covid-19 condition, unspecified: Secondary | ICD-10-CM

## 2022-04-04 DIAGNOSIS — R000 Tachycardia, unspecified: Secondary | ICD-10-CM | POA: Diagnosis not present

## 2022-04-04 MED ORDER — HYDROCODONE BIT-HOMATROP MBR 5-1.5 MG/5ML PO SOLN
5.0000 mL | Freq: Four times a day (QID) | ORAL | 0 refills | Status: DC | PRN
Start: 2022-04-04 — End: 2022-06-12

## 2022-04-04 MED ORDER — BENZONATATE 100 MG PO CAPS: 100.0000 mg | ORAL_CAPSULE | Freq: Three times a day (TID) | ORAL | 0 refills | Status: DC | PRN

## 2022-04-04 NOTE — Progress Notes (Signed)
? ?Established Patient Office Visit ? ?Subjective:  ?Patient ID: Mallory Collins, female    DOB: 06/21/1976  Age: 46 y.o. MRN: [id number] ? ?CC:  ?Chief Complaint  ?Patient presents with  ? Follow-up  ? ? ?HPI ?Mallory Collins presents for the following items ? ?She had sent a note email yesterday that she tested positive for COVID back March 16.  Onset of symptoms on the 13th.  Her husband also had COVID about this time.  She states that she was very ill for about 10 days to weeks with severe fatigue, loss of taste, cough.  She called with persistent cough not relieved with Robitussin or over-the-counter cough drops.  Interfering with sleep.  Increased fatigue.  She does have history of hypothyroidism and is on replacement with levothyroxine taking that regularly.  Denies any dyspnea at rest when not coughing.  No dyspnea with activity such as walking.  No chest pain.  No pleuritic pain.  No leg swelling.  Not aware of any wheezing. ? ?She has tachycardia and states she has had this for years.  Heart rate is up somewhat higher than usual today though.  No diarrhea symptoms.  She does take Cardizem and propranolol as needed ?Is on thyroid replacement but is actually had some weight gain recently ? ?Wt Readings from Last 3 Encounters:  ?04/04/22 157 lb 1.6 oz (71.3 kg)  ?09/06/21 144 lb 12.8 oz (65.7 kg)  ?06/07/20 140 lb 1.6 oz (63.5 kg)  ? ? ? ?Past Medical History:  ?Diagnosis Date  ? Agoraphobia   ? Asthma   ? Chronic fatigue   ? GAD (generalized anxiety disorder)   ? Hypothyroidism   ? MDD (major depressive disorder)   ? Panic attacks   ? ? ?Past Surgical History:  ?Procedure Laterality Date  ? WISDOM TOOTH EXTRACTION    ? ? ?Family History  ?Problem Relation Age of Onset  ? Hypertension Mother   ? Cancer Mother   ?     breast  ? CAD Mother 59  ?     CABG, stents  ? Heart disease Paternal Grandmother   ? ? ?Social History  ? ?Socioeconomic History  ? Marital status: Married  ?  Spouse name: Not on file  ?  Number of children: Not on file  ? Years of education: Not on file  ? Highest education level: Not on file  ?Occupational History  ? Not on file  ?Tobacco Use  ? Smoking status: Never  ? Smokeless tobacco: Never  ?Substance and Sexual Activity  ? Alcohol use: Never  ? Drug use: Never  ? Sexual activity: Not on file  ?Other Topics Concern  ? Not on file  ?Social History Narrative  ? Not on file  ? ?Social Determinants of Health  ? ?Financial Resource Strain: Not on file  ?Food Insecurity: Not on file  ?Transportation Needs: Not on file  ?Physical Activity: Not on file  ?Stress: Not on file  ?Social Connections: Not on file  ?Intimate Partner Violence: Not on file  ? ? ?Outpatient Medications Prior to Visit  ?Medication Sig Dispense Refill  ? albuterol (VENTOLIN HFA) 108 (90 Base) MCG/ACT inhaler TAKE 2 PUFFS BY MOUTH EVERY 6 HOURS AS NEEDED FOR WHEEZE OR SHORTNESS OF BREATH 8.5 each 1  ? amoxicillin-clavulanate (AUGMENTIN) 875-125 MG tablet Take 1 tablet by mouth 2 (two) times daily. 14 tablet 0  ? cholecalciferol (VITAMIN D) 1000 units tablet Take 1,000 Units by mouth daily.    ?  cyclobenzaprine (FLEXERIL) 10 MG tablet Take 1 tablet (10 mg total) by mouth 3 (three) times daily as needed for muscle spasms. 30 tablet 0  ? diltiazem (CARDIZEM) 30 MG tablet Take 1 tablet (30 mg total) by mouth 2 (two) times daily. 60 tablet 6  ? DULoxetine (CYMBALTA) 60 MG capsule TAKE 1 CAPSULE BY MOUTH EVERY DAY 90 capsule 0  ? levothyroxine (SYNTHROID) 137 MCG tablet Take one tablet by mouth daily. 90 tablet 3  ? LORazepam (ATIVAN) 0.5 MG tablet Take 1 tablet (0.5 mg total) by mouth 2 (two) times daily as needed for anxiety. 60 tablet 2  ? Multiple Vitamin (MULTIVITAMIN) tablet Take 1 tablet by mouth daily.    ? naproxen (NAPROSYN) 500 MG tablet Take 1 tablet (500 mg total) by mouth 2 (two) times daily with a meal. 30 tablet 0  ? ondansetron (ZOFRAN) 4 MG tablet TAKE 1 TABLET BY MOUTH EVERY 8 HOURS AS NEEDED FOR NAUSEA AND VOMITING 9  tablet 0  ? propranolol (INDERAL) 10 MG tablet TAKE 1 TABLET BY MOUTH EVERY DAY AS NEEDED 90 tablet 0  ? DULoxetine (CYMBALTA) 30 MG capsule TAKE 1 CAPSULE BY MOUTH EVERY DAY 90 capsule 0  ? ?No facility-administered medications prior to visit.  ? ? ?No Known Allergies ? ?ROS ?Review of Systems  ?Constitutional:  Negative for chills and fever.  ?HENT:  Negative for sore throat.   ?Respiratory:  Positive for cough. Negative for wheezing.   ?Cardiovascular:  Negative for chest pain and leg swelling.  ?Gastrointestinal:  Negative for abdominal pain, diarrhea, nausea and vomiting.  ?Neurological:  Negative for headaches.  ? ?  ?Objective:  ?  ?Physical Exam ?Vitals reviewed.  ?Constitutional:   ?   General: She is not in acute distress. ?   Appearance: Normal appearance. She is not ill-appearing or toxic-appearing.  ?HENT:  ?   Right Ear: Tympanic membrane normal.  ?   Left Ear: Tympanic membrane normal.  ?   Mouth/Throat:  ?   Pharynx: Oropharynx is clear. No posterior oropharyngeal erythema.  ?Cardiovascular:  ?   Comments: Tachycardic with rate about 125 but regular ?Pulmonary:  ?   Effort: Pulmonary effort is normal.  ?   Breath sounds: Normal breath sounds. No wheezing or rales.  ?Neurological:  ?   Mental Status: She is alert.  ? ? ?BP 134/86 (BP Location: Left Arm, Patient Position: Sitting, Cuff Size: Normal)   Pulse (!) 126   Temp (!) 97.5 ?F (36.4 ?C) (Oral)   Ht '5\' 5"'$  (1.651 m)   Wt 157 lb 1.6 oz (71.3 kg)   SpO2 98%   BMI 26.14 kg/m?  ?Wt Readings from Last 3 Encounters:  ?04/04/22 157 lb 1.6 oz (71.3 kg)  ?09/06/21 144 lb 12.8 oz (65.7 kg)  ?06/07/20 140 lb 1.6 oz (63.5 kg)  ? ? ? ?Health Maintenance Due  ?Topic Date Due  ? HIV Screening  Never done  ? Hepatitis C Screening  Never done  ? TETANUS/TDAP  Never done  ? PAP SMEAR-Modifier  Never done  ? COLONOSCOPY (Pts 45-17yr Insurance coverage will need to be confirmed)  Never done  ? ? ?There are no preventive care reminders to display for this  patient. ? ?Lab Results  ?Component Value Date  ? TSH 1.08 09/06/2021  ? ?Lab Results  ?Component Value Date  ? WBC 15.1 (H) 05/22/2010  ? HGB 13.4 05/22/2010  ? HCT 40.5 05/22/2010  ? MCV 87.4 05/22/2010  ? PLT 309  05/22/2010  ? ?Lab Results  ?Component Value Date  ? NA 146 (H) 05/22/2010  ? K 3.2 (L) 05/22/2010  ? CO2 28 05/22/2010  ? GLUCOSE 115 (H) 05/22/2010  ? BUN 8 05/22/2010  ? CREATININE 0.7 05/22/2010  ? CALCIUM 9.2 05/22/2010  ? ?No results found for: CHOL ?No results found for: HDL ?No results found for: [place] ?No results found for: TRIG ?No results found for: CHOLHDL ?No results found for: HGBA1C ? ?  ?Assessment & Plan:  ? ?Problem List Items Addressed This Visit   ? ?  ? Unprioritized  ? Tachycardia  ? Relevant Orders  ? TSH  ? CBC with Differential/Platelet  ? ?Other Visit Diagnoses   ? ? Cough, unspecified type    -  Primary  ? Relevant Orders  ? DG Chest 2 View  ? ?  ?Recent COVID infection.  Persistent severe cough interfering with sleep.  She has nonspecific symptoms increased fatigue which may be related to poor sleep quality. ? ?She does have some slight chronic tachycardia but usual heart rates around 100.  No history of anemia. ? ?-Check CBC and TSH ?-Obtain PA and lateral chest x-ray ?-Trial of Tessalon Perles 100 mg every 8 hours as needed for cough ?-Printed prescription for Hycodan cough syrup 1 teaspoon every 6 hours as needed for severe cough if not relieved with Tessalon. ? ?Meds ordered this encounter  ?Medications  ? benzonatate (TESSALON PERLES) 100 MG capsule  ?  Sig: Take 1 capsule (100 mg total) by mouth 3 (three) times daily as needed for cough.  ?  Dispense:  30 capsule  ?  Refill:  0  ? HYDROcodone bit-homatropine (HYCODAN) 5-1.5 MG/5ML syrup  ?  Sig: Take 5 mLs by mouth every 6 (six) hours as needed for cough.  ?  Dispense:  120 mL  ?  Refill:  0  ? ? ?Follow-up: No follow-ups on file.  ? ? ?Carolann Littler, MD ?

## 2022-04-04 NOTE — Telephone Encounter (Signed)
Noted  

## 2022-04-11 ENCOUNTER — Ambulatory Visit (INDEPENDENT_AMBULATORY_CARE_PROVIDER_SITE_OTHER): Payer: 59

## 2022-04-11 ENCOUNTER — Other Ambulatory Visit (INDEPENDENT_AMBULATORY_CARE_PROVIDER_SITE_OTHER): Payer: 59

## 2022-04-11 DIAGNOSIS — R059 Cough, unspecified: Secondary | ICD-10-CM

## 2022-04-11 DIAGNOSIS — R000 Tachycardia, unspecified: Secondary | ICD-10-CM

## 2022-04-11 LAB — CBC WITH DIFFERENTIAL/PLATELET
Basophils Absolute: 0 10*3/uL (ref 0.0–0.1)
Basophils Relative: 0.4 % (ref 0.0–3.0)
Eosinophils Absolute: 0 10*3/uL (ref 0.0–0.7)
Eosinophils Relative: 0.6 % (ref 0.0–5.0)
HCT: 38.8 % (ref 36.0–46.0)
Hemoglobin: 13 g/dL (ref 12.0–15.0)
Lymphocytes Relative: 32.8 % (ref 12.0–46.0)
Lymphs Abs: 2.5 10*3/uL (ref 0.7–4.0)
MCHC: 33.6 g/dL (ref 30.0–36.0)
MCV: 85.8 fl (ref 78.0–100.0)
Monocytes Absolute: 0.9 10*3/uL (ref 0.1–1.0)
Monocytes Relative: 11.7 % (ref 3.0–12.0)
Neutro Abs: 4.2 10*3/uL (ref 1.4–7.7)
Neutrophils Relative %: 54.5 % (ref 43.0–77.0)
Platelets: 408 10*3/uL — ABNORMAL HIGH (ref 150.0–400.0)
RBC: 4.52 Mil/uL (ref 3.87–5.11)
RDW: 14.8 % (ref 11.5–15.5)
WBC: 7.7 10*3/uL (ref 4.0–10.5)

## 2022-04-11 LAB — TSH: TSH: 3.7 u[IU]/mL (ref 0.35–5.50)

## 2022-04-12 ENCOUNTER — Encounter: Payer: Self-pay | Admitting: Family Medicine

## 2022-04-12 DIAGNOSIS — D75839 Thrombocytosis, unspecified: Secondary | ICD-10-CM

## 2022-04-16 NOTE — Addendum Note (Signed)
Addended by: Agnes Lawrence on: 04/16/2022 08:14 AM ? ? Modules accepted: Orders ? ?

## 2022-04-19 ENCOUNTER — Other Ambulatory Visit: Payer: Self-pay | Admitting: Family Medicine

## 2022-05-15 ENCOUNTER — Other Ambulatory Visit: Payer: Self-pay | Admitting: Adult Health

## 2022-05-15 DIAGNOSIS — F410 Panic disorder [episodic paroxysmal anxiety] without agoraphobia: Secondary | ICD-10-CM

## 2022-05-15 DIAGNOSIS — F411 Generalized anxiety disorder: Secondary | ICD-10-CM

## 2022-05-15 DIAGNOSIS — F4000 Agoraphobia, unspecified: Secondary | ICD-10-CM

## 2022-05-16 NOTE — Telephone Encounter (Signed)
Please call to schedule an appt. Last seen 11/13/21 with RTC in 3 mo.  ?

## 2022-05-16 NOTE — Telephone Encounter (Signed)
Pt called to check on the status of her lorazapam. She is almost out and has had a death in her family ?

## 2022-05-17 NOTE — Telephone Encounter (Signed)
Only send in enough to appt

## 2022-05-17 NOTE — Telephone Encounter (Signed)
Spoke with Mallory Collins at 3:04 today. Patient is scheduled for a MyChart Visit on 6/13.

## 2022-05-17 NOTE — Telephone Encounter (Signed)
LVM for  py to call and schedule

## 2022-05-18 ENCOUNTER — Other Ambulatory Visit: Payer: Self-pay | Admitting: Family Medicine

## 2022-05-23 ENCOUNTER — Other Ambulatory Visit: Payer: Self-pay | Admitting: Family Medicine

## 2022-05-23 ENCOUNTER — Telehealth: Payer: Self-pay

## 2022-05-23 ENCOUNTER — Other Ambulatory Visit: Payer: Self-pay

## 2022-05-23 DIAGNOSIS — F4000 Agoraphobia, unspecified: Secondary | ICD-10-CM

## 2022-05-23 DIAGNOSIS — F411 Generalized anxiety disorder: Secondary | ICD-10-CM

## 2022-05-23 DIAGNOSIS — F410 Panic disorder [episodic paroxysmal anxiety] without agoraphobia: Secondary | ICD-10-CM

## 2022-05-23 MED ORDER — LORAZEPAM 0.5 MG PO TABS: 0.5000 mg | ORAL_TABLET | Freq: Two times a day (BID) | ORAL | 0 refills | Status: DC | PRN

## 2022-05-24 ENCOUNTER — Other Ambulatory Visit: Payer: Self-pay

## 2022-05-24 ENCOUNTER — Other Ambulatory Visit: Payer: Self-pay | Admitting: Adult Health

## 2022-05-24 DIAGNOSIS — F410 Panic disorder [episodic paroxysmal anxiety] without agoraphobia: Secondary | ICD-10-CM

## 2022-05-24 DIAGNOSIS — F411 Generalized anxiety disorder: Secondary | ICD-10-CM

## 2022-05-24 DIAGNOSIS — F4000 Agoraphobia, unspecified: Secondary | ICD-10-CM

## 2022-05-24 MED ORDER — LORAZEPAM 0.5 MG PO TABS: 0.5000 mg | ORAL_TABLET | Freq: Two times a day (BID) | ORAL | 0 refills | Status: DC | PRN

## 2022-05-24 NOTE — Telephone Encounter (Signed)
I've sent this to you twice and it keeps showing as print. Will you please try sending.

## 2022-05-24 NOTE — Telephone Encounter (Signed)
Patient lvm stating she called a few days ago concerning a refill sent to [facility] on [address]. She is inquiring of the status stating nothing about the corrections was submitted to pharmacy. Contact patient per her request. # [phone]

## 2022-05-24 NOTE — Telephone Encounter (Signed)
We may need to call in a verbal.

## 2022-05-24 NOTE — Telephone Encounter (Signed)
Patient notified that we had difficulty trying to send Rx electronically. I told patient that it had been called in today and should be available at the pharmacy.

## 2022-05-24 NOTE — Telephone Encounter (Signed)
Script sent  

## 2022-06-01 ENCOUNTER — Other Ambulatory Visit: Payer: 59

## 2022-06-01 DIAGNOSIS — D75839 Thrombocytosis, unspecified: Secondary | ICD-10-CM

## 2022-06-01 LAB — CBC WITH DIFFERENTIAL/PLATELET
Basophils Absolute: 0 10*3/uL (ref 0.0–0.1)
Basophils Relative: 0.2 % (ref 0.0–3.0)
Eosinophils Absolute: 0.1 10*3/uL (ref 0.0–0.7)
Eosinophils Relative: 1.1 % (ref 0.0–5.0)
HCT: 38.4 % (ref 36.0–46.0)
Hemoglobin: 12.8 g/dL (ref 12.0–15.0)
Lymphocytes Relative: 26 % (ref 12.0–46.0)
Lymphs Abs: 2.2 10*3/uL (ref 0.7–4.0)
MCHC: 33.2 g/dL (ref 30.0–36.0)
MCV: 86.6 fl (ref 78.0–100.0)
Monocytes Absolute: 1 10*3/uL (ref 0.1–1.0)
Monocytes Relative: 11.4 % (ref 3.0–12.0)
Neutro Abs: 5.2 10*3/uL (ref 1.4–7.7)
Neutrophils Relative %: 61.3 % (ref 43.0–77.0)
Platelets: 484 10*3/uL — ABNORMAL HIGH (ref 150.0–400.0)
RBC: 4.44 Mil/uL (ref 3.87–5.11)
RDW: 13.9 % (ref 11.5–15.5)
WBC: 8.5 10*3/uL (ref 4.0–10.5)

## 2022-06-08 ENCOUNTER — Telehealth (INDEPENDENT_AMBULATORY_CARE_PROVIDER_SITE_OTHER): Payer: 59 | Admitting: Family Medicine

## 2022-06-08 ENCOUNTER — Encounter: Payer: Self-pay | Admitting: Family Medicine

## 2022-06-08 VITALS — Ht 65.0 in | Wt 157.1 lb

## 2022-06-08 DIAGNOSIS — D75839 Thrombocytosis, unspecified: Secondary | ICD-10-CM

## 2022-06-08 DIAGNOSIS — R5383 Other fatigue: Secondary | ICD-10-CM

## 2022-06-08 NOTE — Progress Notes (Unsigned)
Patient ID: Mallory Collins, female   DOB: 08-22-1976, 46 y.o.   MRN: [id number]   Virtual Visit via Video Note  I connected with Mallory Collins on 06/08/22 at  1:15 PM EDT by a video enabled telemedicine application and verified that I am speaking with the correct person using two identifiers.  Location patient: home Location provider:work or home office Persons participating in the virtual visit: patient, provider  I discussed the limitations of evaluation and management by telemedicine and the availability of in person appointments. The patient expressed understanding and agreed to proceed.   HPI: Mallory Collins has history of SVT, mild intermittent asthma, hypothyroidism, fibromyalgia.  She had COVID infection back in March.  She had about 10 days of fairly severe illness.  She had persistent cough and was seen in April and had chest x-ray which came back unremarkable.  TSH was checked at that time with her history of hypothyroidism and came back normal range.  She did have mildly elevated platelets 408,000.  We explained this is very nonspecific.  We ended up repeating CBC last week and platelets were up to 484,000.  Normal white count.  Normal hemoglobin.  She states that she has been very fatigued over recent months.  This is much more than usual.  She has undergone the stress of her father dying couple weeks ago and he was under hospice.  She thinks this is more than just dealing with his illness.  She does have night sweats occasionally.  Appetite and weight stable.  No documented fever.  No edema.  No dyspnea.  No chest pain.  No dysuria.  She is not aware of any specific arthralgias or myalgias.  No new rashes.  Past Medical History:  Diagnosis Date   Agoraphobia    Asthma    Chronic fatigue    GAD (generalized anxiety disorder)    Hypothyroidism    MDD (major depressive disorder)    Panic attacks    Past Surgical History:  Procedure Laterality Date   WISDOM TOOTH EXTRACTION       reports that she has never smoked. She has never used smokeless tobacco. She reports that she does not drink alcohol and does not use drugs. family history includes CAD (age of onset: 29) in her mother; Cancer in her mother; Heart disease in her paternal grandmother; Hypertension in her mother. No Known Allergies    ROS: See pertinent positives and negatives per HPI.  Past Medical History:  Diagnosis Date   Agoraphobia    Asthma    Chronic fatigue    GAD (generalized anxiety disorder)    Hypothyroidism    MDD (major depressive disorder)    Panic attacks     Past Surgical History:  Procedure Laterality Date   WISDOM TOOTH EXTRACTION      Family History  Problem Relation Age of Onset   Hypertension Mother    Cancer Mother        breast   CAD Mother 58       CABG, stents   Heart disease Paternal Grandmother     SOCIAL HX: Non-smoker   Current Outpatient Medications:    albuterol (VENTOLIN HFA) 108 (90 Base) MCG/ACT inhaler, TAKE 2 PUFFS BY MOUTH EVERY 6 HOURS AS NEEDED FOR WHEEZE OR SHORTNESS OF BREATH, Disp: 8.5 each, Rfl: 1   cholecalciferol (VITAMIN D) 1000 units tablet, Take 1,000 Units by mouth daily., Disp: , Rfl:    cyclobenzaprine (FLEXERIL) 10 MG tablet, Take 1 tablet (10  mg total) by mouth 3 (three) times daily as needed for muscle spasms., Disp: 30 tablet, Rfl: 0   DULoxetine (CYMBALTA) 60 MG capsule, TAKE 1 CAPSULE BY MOUTH EVERY DAY, Disp: 90 capsule, Rfl: 0   levothyroxine (SYNTHROID) 137 MCG tablet, Take one tablet by mouth daily., Disp: 90 tablet, Rfl: 3   LORazepam (ATIVAN) 0.5 MG tablet, Take 1 tablet (0.5 mg total) by mouth 2 (two) times daily as needed for anxiety., Disp: 40 tablet, Rfl: 0   Multiple Vitamin (MULTIVITAMIN) tablet, Take 1 tablet by mouth daily., Disp: , Rfl:    ondansetron (ZOFRAN) 4 MG tablet, TAKE 1 TABLET BY MOUTH EVERY 8 HOURS AS NEEDED FOR NAUSEA AND VOMITING, Disp: 9 tablet, Rfl: 0   propranolol (INDERAL) 10 MG tablet, TAKE 1 TABLET  BY MOUTH EVERY DAY AS NEEDED, Disp: 90 tablet, Rfl: 0   amoxicillin-clavulanate (AUGMENTIN) 875-125 MG tablet, Take 1 tablet by mouth 2 (two) times daily., Disp: 14 tablet, Rfl: 0   benzonatate (TESSALON PERLES) 100 MG capsule, Take 1 capsule (100 mg total) by mouth 3 (three) times daily as needed for cough., Disp: 30 capsule, Rfl: 0   diltiazem (CARDIZEM) 30 MG tablet, Take 1 tablet (30 mg total) by mouth 2 (two) times daily., Disp: 60 tablet, Rfl: 6   HYDROcodone bit-homatropine (HYCODAN) 5-1.5 MG/5ML syrup, Take 5 mLs by mouth every 6 (six) hours as needed for cough., Disp: 120 mL, Rfl: 0   naproxen (NAPROSYN) 500 MG tablet, Take 1 tablet (500 mg total) by mouth 2 (two) times daily with a meal., Disp: 30 tablet, Rfl: 0  EXAM:  VITALS per patient if applicable:  GENERAL: alert, oriented, appears well and in no acute distress  HEENT: atraumatic, conjunttiva clear, no obvious abnormalities on inspection of external nose and ears  NECK: normal movements of the head and neck  LUNGS: on inspection no signs of respiratory distress, breathing rate appears normal, no obvious gross SOB, gasping or wheezing  CV: no obvious cyanosis  MS: moves all visible extremities without noticeable abnormality  PSYCH/NEURO: pleasant and cooperative, no obvious depression or anxiety, speech and thought processing grossly intact  ASSESSMENT AND PLAN:  Discussed the following assessment and plan:  Mild thrombocytosis by recent labs.  Patient does have nonspecific symptoms of fatigue and occasional night sweats.  No reported weight loss.  We discussed the fact that mild elevation in platelet count is very nonspecific and frequently reactive thrombocytosis.  Given her multiple other symptoms obtain broader screening labs. If platelet count still climbing consider hematology referral. -Watch closely for any new symptoms -future labs with CBC, CMP, sed rate, CRP, urinalysis     I discussed the assessment and  treatment plan with the patient. The patient was provided an opportunity to ask questions and all were answered. The patient agreed with the plan and demonstrated an understanding of the instructions.   The patient was advised to call back or seek an in-person evaluation if the symptoms worsen or if the condition fails to improve as anticipated.     Carolann Littler, MD

## 2022-06-12 ENCOUNTER — Encounter: Payer: Self-pay | Admitting: Adult Health

## 2022-06-12 ENCOUNTER — Other Ambulatory Visit: Payer: 59

## 2022-06-12 ENCOUNTER — Telehealth (INDEPENDENT_AMBULATORY_CARE_PROVIDER_SITE_OTHER): Payer: Self-pay | Admitting: Adult Health

## 2022-06-12 DIAGNOSIS — F4000 Agoraphobia, unspecified: Secondary | ICD-10-CM

## 2022-06-12 DIAGNOSIS — F331 Major depressive disorder, recurrent, moderate: Secondary | ICD-10-CM

## 2022-06-12 DIAGNOSIS — F410 Panic disorder [episodic paroxysmal anxiety] without agoraphobia: Secondary | ICD-10-CM

## 2022-06-12 DIAGNOSIS — F411 Generalized anxiety disorder: Secondary | ICD-10-CM

## 2022-06-12 MED ORDER — SERTRALINE HCL 50 MG PO TABS: 50.0000 mg | ORAL_TABLET | Freq: Every day | ORAL | 2 refills | Status: DC

## 2022-06-12 MED ORDER — LORAZEPAM 0.5 MG PO TABS: 0.5000 mg | ORAL_TABLET | Freq: Three times a day (TID) | ORAL | 0 refills | Status: DC

## 2022-06-12 NOTE — Progress Notes (Signed)
Mallory Collins [id number] [birth year] 46 y.o.  Virtual Visit via Video Note  I connected with pt @ on 06/12/22 at  2:00 PM EDT by a video enabled telemedicine application and verified that I am speaking with the correct person using two identifiers.   I discussed the limitations of evaluation and management by telemedicine and the availability of in person appointments. The patient expressed understanding and agreed to proceed.  I discussed the assessment and treatment plan with the patient. The patient was provided an opportunity to ask questions and all were answered. The patient agreed with the plan and demonstrated an understanding of the instructions.   The patient was advised to call back or seek an in-person evaluation if the symptoms worsen or if the condition fails to improve as anticipated.  I provided 25 minutes of non-face-to-face time during this encounter.  The patient was located at home.  The provider was located at [place].   Aloha Gell, NP   Subjective:   Patient ID:  Mallory Collins is a 46 y.o. (DOB 02-03-1976) female.  Chief Complaint: No chief complaint on file.   HPI Mallory Collins presents for follow-up of GAD, MDD, panic attacks, agoraphobia.   Describes mood today as "ok". Denies tearfulness. Mood symptoms - denies depression and irritability. Increased anxiety "it's off the hook". Reports panic attacks a few times a week. Not wanting to leave the house except for appointments. Stating "I'm very emotional". Father passed away a month ago - kidney and heart failure. Feels like his passing may have intensified her anxiety. Did not notice any benefit from the increase in Cymbalta. Feels like her body is unable to "cope".  The Ativan is not "holding up" to the level of anxiety she's experiencing". Doesn't feel comfortable leaving her home.  Stable interest and motivation. Taking medications as prescribed.  Energy levels lower -  feels drained and exhausted. Active, does not have a regular exercise routine.  Enjoys some usual interests and activities. Married. Lives with husband and mother - 2 dogs. Spending time with family. Appetite adequate. Weight gain - 142 to 160 pounds.  Sleeps well most nights. Averages 9 - 10 hours. Focus and concentration stable. Completing tasks. Managing aspects of household. Unable to work - Fibromyalgia. Denies SI or HI.  Denies AH or VH.  Previous medication trials: Clonazepam - too sedating, Zoloft  Review of Systems:  Review of Systems  Musculoskeletal:  Negative for gait problem.  Neurological:  Negative for tremors.  Psychiatric/Behavioral:         Please refer to HPI    Medications: I have reviewed the patient's current medications.  Current Outpatient Medications  Medication Sig Dispense Refill   albuterol (VENTOLIN HFA) 108 (90 Base) MCG/ACT inhaler TAKE 2 PUFFS BY MOUTH EVERY 6 HOURS AS NEEDED FOR WHEEZE OR SHORTNESS OF BREATH 8.5 each 1   amoxicillin-clavulanate (AUGMENTIN) 875-125 MG tablet Take 1 tablet by mouth 2 (two) times daily. 14 tablet 0   benzonatate (TESSALON PERLES) 100 MG capsule Take 1 capsule (100 mg total) by mouth 3 (three) times daily as needed for cough. 30 capsule 0   cholecalciferol (VITAMIN D) 1000 units tablet Take 1,000 Units by mouth daily.     cyclobenzaprine (FLEXERIL) 10 MG tablet Take 1 tablet (10 mg total) by mouth 3 (three) times daily as needed for muscle spasms. 30 tablet 0   diltiazem (CARDIZEM) 30 MG tablet Take 1 tablet (30 mg total) by mouth 2 (two) times  daily. 60 tablet 6   DULoxetine (CYMBALTA) 60 MG capsule TAKE 1 CAPSULE BY MOUTH EVERY DAY 90 capsule 0   HYDROcodone bit-homatropine (HYCODAN) 5-1.5 MG/5ML syrup Take 5 mLs by mouth every 6 (six) hours as needed for cough. 120 mL 0   levothyroxine (SYNTHROID) 137 MCG tablet Take one tablet by mouth daily. 90 tablet 3   LORazepam (ATIVAN) 0.5 MG tablet Take 1 tablet (0.5 mg total) by  mouth 2 (two) times daily as needed for anxiety. 40 tablet 0   Multiple Vitamin (MULTIVITAMIN) tablet Take 1 tablet by mouth daily.     naproxen (NAPROSYN) 500 MG tablet Take 1 tablet (500 mg total) by mouth 2 (two) times daily with a meal. 30 tablet 0   ondansetron (ZOFRAN) 4 MG tablet TAKE 1 TABLET BY MOUTH EVERY 8 HOURS AS NEEDED FOR NAUSEA AND VOMITING 9 tablet 0   propranolol (INDERAL) 10 MG tablet TAKE 1 TABLET BY MOUTH EVERY DAY AS NEEDED 90 tablet 0   No current facility-administered medications for this visit.    Medication Side Effects: None  Allergies: No Known Allergies  Past Medical History:  Diagnosis Date   Agoraphobia    Asthma    Chronic fatigue    GAD (generalized anxiety disorder)    Hypothyroidism    MDD (major depressive disorder)    Panic attacks     Family History  Problem Relation Age of Onset   Hypertension Mother    Cancer Mother        breast   CAD Mother 18       CABG, stents   Heart disease Paternal Grandmother     Social History   Socioeconomic History   Marital status: Married    Spouse name: Not on file   Number of children: Not on file   Years of education: Not on file   Highest education level: Not on file  Occupational History   Not on file  Tobacco Use   Smoking status: Never   Smokeless tobacco: Never  Substance and Sexual Activity   Alcohol use: Never   Drug use: Never   Sexual activity: Not on file  Other Topics Concern   Not on file  Social History Narrative   Not on file   Social Determinants of Health   Financial Resource Strain: Not on file  Food Insecurity: Not on file  Transportation Needs: Not on file  Physical Activity: Not on file  Stress: Not on file  Social Connections: Not on file  Intimate Partner Violence: Not on file    Past Medical History, Surgical history, Social history, and Family history were reviewed and updated as appropriate.   Please see review of systems for further details on the  patient's review from today.   Objective:   Physical Exam:  There were no vitals taken for this visit.  Physical Exam Constitutional:      General: She is not in acute distress. Musculoskeletal:        General: No deformity.  Neurological:     Mental Status: She is alert and oriented to person, place, and time.     Coordination: Coordination normal.  Psychiatric:        Attention and Perception: Attention and perception normal. She does not perceive auditory or visual hallucinations.        Mood and Affect: Mood normal. Mood is not anxious or depressed. Affect is not labile, blunt, angry or inappropriate.  Speech: Speech normal.        Behavior: Behavior normal.        Thought Content: Thought content normal. Thought content is not paranoid or delusional. Thought content does not include homicidal or suicidal ideation. Thought content does not include homicidal or suicidal plan.        Cognition and Memory: Cognition and memory normal.        Judgment: Judgment normal.     Comments: Insight intact     Lab Review:     Component Value Date/Time   NA 146 (H) 05/22/2010 0100   K 3.2 (L) 05/22/2010 0100   CL 103 05/22/2010 0100   CO2 28 05/22/2010 0100   GLUCOSE 115 (H) 05/22/2010 0100   BUN 8 05/22/2010 0100   CREATININE 0.7 05/22/2010 0100   CALCIUM 9.2 05/22/2010 0100   GFRNONAA >60 05/22/2010 0100   GFRAA  05/22/2010 0100    >60        The eGFR has been calculated using the MDRD equation. This calculation has not been validated in all clinical situations. eGFR's persistently <60 mL/min signify possible Chronic Kidney Disease.       Component Value Date/Time   WBC 8.5 06/01/2022 1346   RBC 4.44 06/01/2022 1346   HGB 12.8 06/01/2022 1346   HCT 38.4 06/01/2022 1346   PLT 484.0 (H) 06/01/2022 1346   MCV 86.6 06/01/2022 1346   MCHC 33.2 06/01/2022 1346   RDW 13.9 06/01/2022 1346   LYMPHSABS 2.2 06/01/2022 1346   MONOABS 1.0 06/01/2022 1346   EOSABS 0.1  06/01/2022 1346   BASOSABS 0.0 06/01/2022 1346    No results found for: "POCLITH", "LITHIUM"   No results found for: "PHENYTOIN", "PHENOBARB", "VALPROATE", "CBMZ"   .res Assessment: Plan:    Plan:  PDMP reviewed  1. Ativan 0.107m daily to BID as needed for anxiety/agoraphobia  Taking Cymbalta - 643mdaily for fibromyalgia  RTC 3 months   Patient advised to contact office with any questions, adverse effects, or acute worsening in signs and symptoms.  Discussed potential benefits, risk, and side effects of benzodiazepines to include potential risk of tolerance and dependence, as well as possible drowsiness.  Advised patient not to drive if experiencing drowsiness and to take lowest possible effective dose to minimize risk of dependence and tolerance.  There are no diagnoses linked to this encounter.   Please see After Visit Summary for patient specific instructions.  Future Appointments  Date Time Provider [facility]6/16/2023  1:20 PM [facility]    No orders of the defined types were placed in this encounter.     -------------------------------

## 2022-06-15 ENCOUNTER — Other Ambulatory Visit (INDEPENDENT_AMBULATORY_CARE_PROVIDER_SITE_OTHER): Payer: 59

## 2022-06-15 DIAGNOSIS — R5383 Other fatigue: Secondary | ICD-10-CM | POA: Diagnosis not present

## 2022-06-15 DIAGNOSIS — D75839 Thrombocytosis, unspecified: Secondary | ICD-10-CM | POA: Diagnosis not present

## 2022-06-15 LAB — URINALYSIS, ROUTINE W REFLEX MICROSCOPIC
Bilirubin Urine: NEGATIVE
Ketones, ur: NEGATIVE
Leukocytes,Ua: NEGATIVE
Nitrite: NEGATIVE
Specific Gravity, Urine: 1.01 (ref 1.000–1.030)
Total Protein, Urine: NEGATIVE
Urine Glucose: NEGATIVE
Urobilinogen, UA: 0.2 (ref 0.0–1.0)
WBC, UA: NONE SEEN (ref 0–?)
pH: 8.5 — AB (ref 5.0–8.0)

## 2022-06-15 LAB — CBC WITH DIFFERENTIAL/PLATELET
Basophils Absolute: 0 10*3/uL (ref 0.0–0.1)
Basophils Relative: 0.2 % (ref 0.0–3.0)
Eosinophils Absolute: 0.1 10*3/uL (ref 0.0–0.7)
Eosinophils Relative: 0.8 % (ref 0.0–5.0)
HCT: 39 % (ref 36.0–46.0)
Hemoglobin: 13 g/dL (ref 12.0–15.0)
Lymphocytes Relative: 22.9 % (ref 12.0–46.0)
Lymphs Abs: 1.7 10*3/uL (ref 0.7–4.0)
MCHC: 33.3 g/dL (ref 30.0–36.0)
MCV: 85.7 fl (ref 78.0–100.0)
Monocytes Absolute: 0.8 10*3/uL (ref 0.1–1.0)
Monocytes Relative: 10.4 % (ref 3.0–12.0)
Neutro Abs: 5 10*3/uL (ref 1.4–7.7)
Neutrophils Relative %: 65.7 % (ref 43.0–77.0)
Platelets: 457 10*3/uL — ABNORMAL HIGH (ref 150.0–400.0)
RBC: 4.55 Mil/uL (ref 3.87–5.11)
RDW: 14.2 % (ref 11.5–15.5)
WBC: 7.6 10*3/uL (ref 4.0–10.5)

## 2022-06-15 LAB — COMPREHENSIVE METABOLIC PANEL
ALT: 12 U/L (ref 0–35)
AST: 20 U/L (ref 0–37)
Albumin: 4.2 g/dL (ref 3.5–5.2)
Alkaline Phosphatase: 80 U/L (ref 39–117)
BUN: 8 mg/dL (ref 6–23)
CO2: 31 mEq/L (ref 19–32)
Calcium: 9.7 mg/dL (ref 8.4–10.5)
Chloride: 101 mEq/L (ref 96–112)
Creatinine, Ser: 0.79 mg/dL (ref 0.40–1.20)
GFR: 89.25 mL/min (ref >60.00)
Glucose, Bld: 96 mg/dL (ref 70–99)
Potassium: 4.3 mEq/L (ref 3.5–5.1)
Sodium: 138 mEq/L (ref 135–145)
Total Bilirubin: 0.4 mg/dL (ref 0.2–1.2)
Total Protein: 7.4 g/dL (ref 6.0–8.3)

## 2022-06-15 LAB — SEDIMENTATION RATE: Sed Rate: 24 mm/hr — ABNORMAL HIGH (ref 0–20)

## 2022-06-15 LAB — C-REACTIVE PROTEIN: CRP: <1 mg/dL (ref 0.5–20.0)

## 2022-06-19 NOTE — Addendum Note (Signed)
Addended by: Nilda Riggs on: 06/19/2022 08:06 AM   Modules accepted: Orders

## 2022-06-25 ENCOUNTER — Other Ambulatory Visit: Payer: Self-pay | Admitting: Family

## 2022-06-25 DIAGNOSIS — D509 Iron deficiency anemia, unspecified: Secondary | ICD-10-CM

## 2022-06-25 DIAGNOSIS — D75839 Thrombocytosis, unspecified: Secondary | ICD-10-CM

## 2022-06-26 ENCOUNTER — Encounter: Payer: Self-pay | Admitting: Family

## 2022-06-26 ENCOUNTER — Inpatient Hospital Stay (HOSPITAL_BASED_OUTPATIENT_CLINIC_OR_DEPARTMENT_OTHER): Payer: 59 | Admitting: Family

## 2022-06-26 ENCOUNTER — Inpatient Hospital Stay: Payer: 59 | Attending: Hematology & Oncology

## 2022-06-26 VITALS — BP 122/82 | HR 101 | Temp 98.5°F | Resp 17 | Ht 66.0 in | Wt 158.8 lb

## 2022-06-26 DIAGNOSIS — Z79899 Other long term (current) drug therapy: Secondary | ICD-10-CM | POA: Diagnosis not present

## 2022-06-26 DIAGNOSIS — D509 Iron deficiency anemia, unspecified: Secondary | ICD-10-CM

## 2022-06-26 DIAGNOSIS — D75839 Thrombocytosis, unspecified: Secondary | ICD-10-CM | POA: Diagnosis present

## 2022-06-26 DIAGNOSIS — Z803 Family history of malignant neoplasm of breast: Secondary | ICD-10-CM | POA: Diagnosis not present

## 2022-06-26 DIAGNOSIS — Z801 Family history of malignant neoplasm of trachea, bronchus and lung: Secondary | ICD-10-CM | POA: Insufficient documentation

## 2022-06-26 LAB — SAVE SMEAR(SSMR), FOR PROVIDER SLIDE REVIEW

## 2022-06-26 LAB — CBC WITH DIFFERENTIAL (CANCER CENTER ONLY)
Abs Immature Granulocytes: 0.02 10*3/uL (ref 0.00–0.07)
Basophils Absolute: 0 10*3/uL (ref 0.0–0.1)
Basophils Relative: 0 %
Eosinophils Absolute: 0 10*3/uL (ref 0.0–0.5)
Eosinophils Relative: 0 %
HCT: 39.1 % (ref 36.0–46.0)
Hemoglobin: 12.6 g/dL (ref 12.0–15.0)
Immature Granulocytes: 0 %
Lymphocytes Relative: 27 %
Lymphs Abs: 2.2 10*3/uL (ref 0.7–4.0)
MCH: 28.1 pg (ref 26.0–34.0)
MCHC: 32.2 g/dL (ref 30.0–36.0)
MCV: 87.1 fL (ref 80.0–100.0)
Monocytes Absolute: 0.8 10*3/uL (ref 0.1–1.0)
Monocytes Relative: 10 %
Neutro Abs: 5.2 10*3/uL (ref 1.7–7.7)
Neutrophils Relative %: 63 %
Platelet Count: 398 10*3/uL (ref 150–400)
RBC: 4.49 MIL/uL (ref 3.87–5.11)
RDW: 13.1 % (ref 11.5–15.5)
WBC Count: 8.2 10*3/uL (ref 4.0–10.5)
nRBC: 0 % (ref 0.0–0.2)

## 2022-06-26 LAB — CMP (CANCER CENTER ONLY)
ALT: 6 U/L (ref 0–44)
AST: 14 U/L — ABNORMAL LOW (ref 15–41)
Albumin: 4.3 g/dL (ref 3.5–5.0)
Alkaline Phosphatase: 78 U/L (ref 38–126)
Anion gap: 8 (ref 5–15)
BUN: 8 mg/dL (ref 6–20)
CO2: 30 mmol/L (ref 22–32)
Calcium: 9.5 mg/dL (ref 8.9–10.3)
Chloride: 103 mmol/L (ref 98–111)
Creatinine: 0.81 mg/dL (ref 0.44–1.00)
GFR, Estimated: >60 mL/min (ref >60)
Glucose, Bld: 105 mg/dL — ABNORMAL HIGH (ref 70–99)
Potassium: 3.9 mmol/L (ref 3.5–5.1)
Sodium: 141 mmol/L (ref 135–145)
Total Bilirubin: 0.4 mg/dL (ref 0.3–1.2)
Total Protein: 7.2 g/dL (ref 6.5–8.1)

## 2022-06-26 LAB — FERRITIN: Ferritin: 4 ng/mL — ABNORMAL LOW (ref 11–307)

## 2022-06-26 LAB — LACTATE DEHYDROGENASE: LDH: 123 U/L (ref 98–192)

## 2022-06-26 NOTE — Progress Notes (Signed)
Hematology/Oncology Consultation   Name: Mallory Collins      MRN: [id number]    Location: Room/bed info not found  Date: 06/26/2022 Time:2:59 PM   REFERRING PHYSICIAN:  Carolann Littler, MD  REASON FOR CONSULT:  Thrombocytosis    DIAGNOSIS: Mild thrombocytosis   HISTORY OF PRESENT ILLNESS:  Mallory Collins is a very pleasant 46 yo caucasian female for mild thrombocytosis noted recently and with recheck.  Her cycle is regular with heavy flow. She states that she was on her cycle when her previous labs were drawn and her platelet count was mildly elevated.  No other blood loss noted. No petechiae.  She notes fatigue, lightheadedness, palpitations (takes inderal, history of SVT) and bruising easily the week before her cycle.  She has occasional night sweats the past few months but does not feel hot when this happens.  She has history of vitamin d deficiency and takes a supplement daily.  She has also been on B 12 injections in the past.  No fever, chills, n/v, cough, rash, dizziness, chest pain, palpitations, abdominal pain or changes in bowel or bladder habits.  She notes occasional SOB with asthma exacerbation.  No history of thrombotic event.  She had 1 miscarriage within the first few weeks of pregnancy 10 years ago.  No history of diabetes or thyroid disease.  Her father had history of amyloidosis and recently passed away.  She has had skin cancer removed in the past but cannot remember the type.  Her mother had history of breast cancer and maternal grandfather had lung cancer.  She states that she had her mammogram earlier this year and results were negative.  No swelling, tenderness, numbness or tingling in her extremities.  No falls or syncope.  No smoking, ETOH or recreational drug use.  Appetite and hydration are good. Her weight is stable at 158 lbs.  She is a Materials engineer.   ROS: All other 10 point review of systems is negative.   PAST MEDICAL HISTORY:   Past Medical History:   Diagnosis Date   Agoraphobia    Asthma    Chronic fatigue    GAD (generalized anxiety disorder)    Hypothyroidism    MDD (major depressive disorder)    Panic attacks     ALLERGIES: No Known Allergies    MEDICATIONS:  Current Outpatient Medications on File Prior to Visit  Medication Sig Dispense Refill   albuterol (VENTOLIN HFA) 108 (90 Base) MCG/ACT inhaler TAKE 2 PUFFS BY MOUTH EVERY 6 HOURS AS NEEDED FOR WHEEZE OR SHORTNESS OF BREATH 8.5 each 1   amoxicillin (AMOXIL) 500 MG capsule Take 500 mg by mouth 3 (three) times daily.     cholecalciferol (VITAMIN D) 1000 units tablet Take 1,000 Units by mouth daily.     DULoxetine (CYMBALTA) 60 MG capsule TAKE 1 CAPSULE BY MOUTH EVERY DAY 90 capsule 0   levothyroxine (SYNTHROID) 137 MCG tablet Take one tablet by mouth daily. 90 tablet 3   LORazepam (ATIVAN) 0.5 MG tablet Take 1 tablet (0.5 mg total) by mouth 3 (three) times daily. 90 tablet 0   Multiple Vitamin (MULTIVITAMIN) tablet Take 1 tablet by mouth daily.     ondansetron (ZOFRAN) 4 MG tablet TAKE 1 TABLET BY MOUTH EVERY 8 HOURS AS NEEDED FOR NAUSEA AND VOMITING 9 tablet 0   propranolol (INDERAL) 10 MG tablet TAKE 1 TABLET BY MOUTH EVERY DAY AS NEEDED 90 tablet 0   sertraline (ZOLOFT) 50 MG tablet Take 1 tablet (50 mg  total) by mouth daily. 30 tablet 2   No current facility-administered medications on file prior to visit.     PAST SURGICAL HISTORY Past Surgical History:  Procedure Laterality Date   WISDOM TOOTH EXTRACTION      FAMILY HISTORY: Family History  Problem Relation Age of Onset   Hypertension Mother    Cancer Mother        breast   CAD Mother 3       CABG, stents   Heart disease Paternal Grandmother     SOCIAL HISTORY:  reports that she has never smoked. She has never used smokeless tobacco. She reports that she does not drink alcohol and does not use drugs.  PERFORMANCE STATUS: The patient's performance status is 1 - Symptomatic but completely  ambulatory  PHYSICAL EXAM: Most Recent Vital Signs: Blood pressure 122/82, pulse (!) 101, temperature 98.5 F (36.9 C), temperature source Oral, resp. rate 17, height '5\' 6"'$  (1.676 m), weight 158 lb 12.8 oz (72 kg), SpO2 100 %. BP 122/82 (BP Location: Right Arm, Patient Position: Sitting)   Pulse (!) 101   Temp 98.5 F (36.9 C) (Oral)   Resp 17   Ht '5\' 6"'$  (1.676 m)   Wt 158 lb 12.8 oz (72 kg)   SpO2 100%   BMI 25.63 kg/m   General Appearance:    Alert, cooperative, no distress, appears stated age  Head:    Normocephalic, without obvious abnormality, atraumatic  Eyes:    PERRL, conjunctiva/corneas clear, EOM's intact, fundi    benign, both eyes        Throat:   Lips, mucosa, and tongue normal; teeth and gums normal  Neck:   Supple, symmetrical, trachea midline, no adenopathy;    thyroid:  no enlargement/tenderness/nodules; no carotid   bruit or JVD  Back:     Symmetric, no curvature, ROM normal, no CVA tenderness  Lungs:     Clear to auscultation bilaterally, respirations unlabored  Chest Wall:    No tenderness or deformity   Heart:    Regular rate and rhythm, S1 and S2 normal, no murmur, rub   or gallop     Abdomen:     Soft, non-tender, bowel sounds active all four quadrants,    no masses, no organomegaly        Extremities:   Extremities normal, atraumatic, no cyanosis or edema  Pulses:   2+ and symmetric all extremities  Skin:   Skin color, texture, turgor normal, no rashes or lesions  Lymph nodes:   Cervical, supraclavicular, and axillary nodes normal  Neurologic:   CNII-XII intact, normal strength, sensation and reflexes    throughout    LABORATORY DATA:  Results for orders placed or performed in visit on 06/26/22 (from the past 48 hour(s))  CBC with Differential (Cancer Center Only)     Status: None   Collection Time: 06/26/22  2:20 PM  Result Value Ref Range   WBC Count 8.2 4.0 - 10.5 K/uL   RBC 4.49 3.87 - 5.11 MIL/uL   Hemoglobin 12.6 12.0 - 15.0 g/dL   HCT  39.1 36.0 - 46.0 %   MCV 87.1 80.0 - 100.0 fL   MCH 28.1 26.0 - 34.0 pg   MCHC 32.2 30.0 - 36.0 g/dL   RDW 13.1 11.5 - 15.5 %   Platelet Count 398 150 - 400 K/uL   nRBC 0.0 0.0 - 0.2 %   Neutrophils Relative % 63 %   Neutro Abs 5.2 1.7 - 7.7  K/uL   Lymphocytes Relative 27 %   Lymphs Abs 2.2 0.7 - 4.0 K/uL   Monocytes Relative 10 %   Monocytes Absolute 0.8 0.1 - 1.0 K/uL   Eosinophils Relative 0 %   Eosinophils Absolute 0.0 0.0 - 0.5 K/uL   Basophils Relative 0 %   Basophils Absolute 0.0 0.0 - 0.1 K/uL   Immature Granulocytes 0 %   Abs Immature Granulocytes 0.02 0.00 - 0.07 K/uL    Comment: Performed at [facility] at [facility], [address], [place], [place] [address]  CMP ([place] only)     Status: Abnormal   Collection Time: 06/26/22  2:20 PM  Result Value Ref Range   Sodium 141 135 - 145 mmol/L   Potassium 3.9 3.5 - 5.1 mmol/L   Chloride 103 98 - 111 mmol/L   CO2 30 22 - 32 mmol/L   Glucose, Bld 105 (H) 70 - 99 mg/dL    Comment: Glucose reference range applies only to samples taken after fasting for at least 8 hours.   BUN 8 6 - 20 mg/dL   Creatinine 0.81 0.44 - 1.00 mg/dL   Calcium 9.5 8.9 - 10.3 mg/dL   Total Protein 7.2 6.5 - 8.1 g/dL   Albumin 4.3 3.5 - 5.0 g/dL   AST 14 (L) 15 - 41 U/L   ALT 6 0 - 44 U/L   Alkaline Phosphatase 78 38 - 126 U/L   Total Bilirubin 0.4 0.3 - 1.2 mg/dL   GFR, Estimated >60 >60 mL/min    Comment: (NOTE) Calculated using the CKD-EPI Creatinine Equation (2021)    Anion gap 8 5 - 15    Comment: Performed at [facility] at [facility], [address], [place], [place] [address]  [facility])     Status: None   Collection Time: 06/26/22  2:20 PM  Result Value Ref Range   Smear Review SMEAR STAINED AND AVAILABLE FOR REVIEW     Comment: Performed at [facility] at [facility], [address] [place], [place] [address]      RADIOGRAPHY: No  results found.     PATHOLOGY: None  ASSESSMENT/PLAN: Mallory Collins is a very pleasant 46 yo caucasian female for mild thrombocytosis noted recently and with recheck.  CBC reviewed with MD. Iron saturation is low at 12% and ferritin only 4.  Platelet count is normal at this time. Elevation is felt to be reactive due to IDA with cycle at the time.  She will try taking Fergon PO daily. Prescription sent.  Follow-up in 3 months. If counts are improved at that time we will release her back to her PCP.   All questions were answered. The patient knows to call the clinic with any problems, questions or concerns. We can certainly see the patient much sooner if necessary.  The patient was discussed with Dr. Marin Olp and he is in agreement with the aforementioned.   Lottie Dawson, NP

## 2022-06-27 ENCOUNTER — Encounter: Payer: Self-pay | Admitting: Family

## 2022-06-27 LAB — IRON AND IRON BINDING CAPACITY (CC-WL,HP ONLY)
Iron: 51 ug/dL (ref 28–170)
Saturation Ratios: 12 % (ref 10.4–31.8)
TIBC: 435 ug/dL (ref 250–450)
UIBC: 384 ug/dL (ref 148–442)

## 2022-06-27 MED ORDER — FERROUS GLUCONATE 324 (38 FE) MG PO TABS: 324.0000 mg | ORAL_TABLET | Freq: Every day | ORAL | 3 refills | Status: DC

## 2022-06-29 ENCOUNTER — Telehealth: Payer: Self-pay | Admitting: *Deleted

## 2022-06-29 NOTE — Telephone Encounter (Signed)
Per 06/26/22 los - called and lvm upcoming appointments

## 2022-06-30 ENCOUNTER — Telehealth: Payer: 59 | Admitting: Emergency Medicine

## 2022-06-30 DIAGNOSIS — L259 Unspecified contact dermatitis, unspecified cause: Secondary | ICD-10-CM | POA: Diagnosis not present

## 2022-06-30 MED ORDER — PREDNISONE 10 MG (21) PO TBPK: ORAL_TABLET | ORAL | 0 refills | Status: DC

## 2022-06-30 NOTE — Progress Notes (Signed)
E Visit for Rash  We are sorry that you are not feeling well. Here is how we plan to help!  Based on what you shared with me it looks like you have contact dermatitis.  Contact dermatitis is a skin rash caused by something that touches the skin and causes irritation or inflammation.  Your skin may be red, swollen, dry, cracked, and itch.  The rash should go away in a few days but can last a few weeks.  If you get a rash, it's important to figure out what caused it so the irritant can be avoided in the future.  I recommend you take Benadryl 25 mg - 50 mg every 4 hours to control the symptoms but if they last over 24 hours it is best that you see an office based provider for follow up.  Your rash looks a little like hives in the pictures but could also be contact dermatitis. I have prescribed prednisone for you to help relieve the rash and itching.   HOME CARE:  Take cool showers and avoid direct sunlight. Apply cool compress or wet dressings. Take a bath in an oatmeal bath.  Sprinkle content of one Aveeno packet under running faucet with comfortably warm water.  Bathe for 15-20 minutes, 1-2 times daily.  Pat dry with a towel. Do not rub the rash. Use hydrocortisone cream. Take an antihistamine like Benadryl for widespread rashes that itch.  The adult dose of Benadryl is 25-50 mg by mouth 4 times daily. Caution:  This type of medication may cause sleepiness.  Do not drink alcohol, drive, or operate dangerous machinery while taking antihistamines.  Do not take these medications if you have prostate enlargement.  Read package instructions thoroughly on all medications that you take.  GET HELP RIGHT AWAY IF:  Symptoms don't go away after treatment. Severe itching that persists. If you rash spreads or swells. If you rash begins to smell. If it blisters and opens or develops a yellow-brown crust. You develop a fever. You have a sore throat. You become short of breath.  MAKE SURE  YOU:  Understand these instructions. Will watch your condition. Will get help right away if you are not doing well or get worse.  Thank you for choosing an e-visit.  Your e-visit answers were reviewed by a board certified advanced clinical practitioner to complete your personal care plan. Depending upon the condition, your plan could have included both over the counter or prescription medications.  Please review your pharmacy choice. Make sure the pharmacy is open so you can pick up prescription now. If there is a problem, you may contact your provider through [employer] and have the prescription routed to another pharmacy.  Your safety is important to [place]. If you have drug allergies check your prescription carefully.   For the next 24 hours you can use MyChart to ask questions about today's visit, request a non-urgent call back, or ask for a work or school excuse. You will get an email in the next two days asking about your experience. I hope that your e-visit has been valuable and will speed your recovery.  I have spent 5 minutes in review of e-visit questionnaire, review and updating patient chart, medical decision making and response to patient.   Willeen Cass, PhD, FNP-BC

## 2022-07-05 ENCOUNTER — Other Ambulatory Visit: Payer: Self-pay | Admitting: Adult Health

## 2022-07-05 DIAGNOSIS — F411 Generalized anxiety disorder: Secondary | ICD-10-CM

## 2022-07-21 ENCOUNTER — Telehealth: Payer: 59 | Admitting: Nurse Practitioner

## 2022-07-21 ENCOUNTER — Telehealth: Payer: 59

## 2022-07-21 DIAGNOSIS — L509 Urticaria, unspecified: Secondary | ICD-10-CM | POA: Diagnosis not present

## 2022-07-21 MED ORDER — PREDNISONE 10 MG (21) PO TBPK: ORAL_TABLET | ORAL | 0 refills | Status: DC

## 2022-07-21 MED ORDER — HYDROXYZINE PAMOATE 25 MG PO CAPS: 25.0000 mg | ORAL_CAPSULE | Freq: Three times a day (TID) | ORAL | 0 refills | Status: DC | PRN

## 2022-07-21 NOTE — Progress Notes (Signed)
Virtual Visit Consent   Mallory Collins, you are scheduled for a virtual visit with Mallory Collins, [place], a [facility] provider, today.     Just as with appointments in the office, your consent must be obtained to participate.  Your consent will be active for this visit and any virtual visit you may have with one of our providers in the next 365 days.     If you have a MyChart account, a copy of this consent can be sent to you electronically.  All virtual visits are billed to your insurance company just like a traditional visit in the office.    As this is a virtual visit, video technology does not allow for your provider to perform a traditional examination.  This may limit your provider's ability to fully assess your condition.  If your provider identifies any concerns that need to be evaluated in person or the need to arrange testing (such as labs, EKG, etc.), we will make arrangements to do so.     Although advances in technology are sophisticated, we cannot ensure that it will always work on either your end or our end.  If the connection with a video visit is poor, the visit may have to be switched to a telephone visit.  With either a video or telephone visit, we are not always able to ensure that we have a secure connection.     I need to obtain your verbal consent now.   Are you willing to proceed with your visit today? YES   Kathrina Crosley has provided verbal consent on 07/21/2022 for a virtual visit (video or telephone).   Mallory Hassell Done, FNP   Date: 07/21/2022 1:05 PM   Virtual Visit via Video Note   I, Mallory Collins, connected with Mallory Collins ([id number], [birth year]) on 07/21/22 at  1:00 PM EDT by a video-enabled telemedicine application and verified that I am speaking with the correct person using two identifiers.  Location: Patient: Virtual Visit Location Patient: Home Provider: Virtual Visit Location Provider: Mobile   I discussed the  limitations of evaluation and management by telemedicine and the availability of in person appointments. The patient expressed understanding and agreed to proceed.    History of Present Illness: Mallory Collins is a 46 y.o. who identifies as a female who was assigned female at birth, and is being seen today for hives.  HPI: Urticaria This is a new (needs root canal and dentist placed her on antiobiotics prior to root ccanal.) problem. The current episode started more than 1 year ago (was put on amoxicillin and developed hives. was changed to doxycycline and hives continued and now on clindamycin and has hives again.). The problem has been gradually worsening since onset. The rash is diffuse. The rash is characterized by itchiness. Past treatments include antihistamine. The treatment provided mild relief.    Review of Systems  Constitutional: Negative.   Respiratory: Negative.    Cardiovascular: Negative.   Neurological: Negative.   Psychiatric/Behavioral: Negative.      Problems:  Patient Active Problem List   Diagnosis Date Noted   Asthma, mild intermittent 06/07/2020   Hypothyroidism 06/07/2020   Fibromyalgia 06/07/2020   Tachycardia 10/11/2017   SVT (supraventricular tachycardia) (HCC) 10/11/2017   Asthma 04/04/2009   Chronic fatigue and immune dysfunction syndrome ([place]) 01/04/1991    Allergies: No Known Allergies Medications:  Current Outpatient Medications:    albuterol (VENTOLIN HFA) 108 (90 Base) MCG/ACT inhaler, TAKE 2 PUFFS BY MOUTH  EVERY 6 HOURS AS NEEDED FOR WHEEZE OR SHORTNESS OF BREATH, Disp: 8.5 each, Rfl: 1   amoxicillin (AMOXIL) 500 MG capsule, Take 500 mg by mouth 3 (three) times daily., Disp: , Rfl:    cholecalciferol (VITAMIN D) 1000 units tablet, Take 1,000 Units by mouth daily., Disp: , Rfl:    DULoxetine (CYMBALTA) 60 MG capsule, TAKE 1 CAPSULE BY MOUTH EVERY DAY, Disp: 90 capsule, Rfl: 0   ferrous gluconate (FERGON) 324 MG tablet, Take 1 tablet (324 mg  total) by mouth daily with breakfast., Disp: 30 tablet, Rfl: 3   levothyroxine (SYNTHROID) 137 MCG tablet, Take one tablet by mouth daily., Disp: 90 tablet, Rfl: 3   LORazepam (ATIVAN) 0.5 MG tablet, Take 1 tablet (0.5 mg total) by mouth 3 (three) times daily., Disp: 90 tablet, Rfl: 0   Multiple Vitamin (MULTIVITAMIN) tablet, Take 1 tablet by mouth daily., Disp: , Rfl:    ondansetron (ZOFRAN) 4 MG tablet, TAKE 1 TABLET BY MOUTH EVERY 8 HOURS AS NEEDED FOR NAUSEA AND VOMITING, Disp: 9 tablet, Rfl: 0   predniSONE (STERAPRED UNI-PAK 21 TAB) 10 MG (21) TBPK tablet, Take 6 tabs on day 1; take 5 tabs day 2; take 4 tabs day 3; take 3 tabs day 4; take 2 tabs day 5; take 1 tab day 6, Disp: 21 tablet, Rfl: 0   propranolol (INDERAL) 10 MG tablet, TAKE 1 TABLET BY MOUTH EVERY DAY AS NEEDED, Disp: 90 tablet, Rfl: 0   sertraline (ZOLOFT) 50 MG tablet, TAKE 1 TABLET BY MOUTH EVERY DAY, Disp: 90 tablet, Rfl: 0  Observations/Objective: Patient is well-developed, well-nourished in no acute distress.  Resting comfortably  at home.  Head is normocephalic, atraumatic.  No labored breathing.  Speech is clear and coherent with logical content.  Patient is alert and oriented at baseline.  Varying size of hives on bil forearms  Assessment and Plan:  Mallory Collins in today with chief complaint of Urticaria   1. Urticaria Stop all antibiotics until rash resolves Avoid scratching Cool compresses Sedation precautions - hydrOXYzine (VISTARIL) 25 MG capsule; Take 1 capsule (25 mg total) by mouth every 8 (eight) hours as needed.  Dispense: 30 capsule; Refill: 0 - predniSONE (STERAPRED UNI-PAK 21 TAB) 10 MG (21) TBPK tablet; As directed x 6 days  Dispense: 21 tablet; Refill: 0      Follow Up Instructions: I discussed the assessment and treatment plan with the patient. The patient was provided an opportunity to ask questions and all were answered. The patient agreed with the plan and demonstrated an  understanding of the instructions.  A copy of instructions were sent to the patient via MyChart.  The patient was advised to call back or seek an in-person evaluation if the symptoms worsen or if the condition fails to improve as anticipated.  Time:  I spent 7 minutes with the patient via telehealth technology discussing the above problems/concerns.    Mallory Hassell Done, FNP

## 2022-07-21 NOTE — Patient Instructions (Signed)
Hives Hives are itchy, red, swollen areas on your skin. Hives can show up on any part of your body. Hives often fade within 24 hours (acute hives). New hives can show up after old ones fade. This can go on for many days or weeks (chronic hives). Hives do not spread from person to person (are not contagious). Hives are caused by your body's response to something that you are allergic to (allergen). These are sometimes called triggers. You can get hives right after being around a trigger, or hours later. What are the causes? Allergies to foods. Insect bites or stings. Exposure to pollen or pets. Spending time in sunlight, heat, or cold. Exercise. Stress. You can also get hives from other medical conditions and treatments, such as: Some medicines. Chemicals or latex. Viruses. This includes the common cold. Infections caused by germs (bacteria). Allergy shots. Blood transfusions. Sometimes, the cause is not known. What increases the risk? Being a woman. Being allergic to foods such as: Citrus fruits. Milk. Eggs. Peanuts. Tree nuts. Shellfish. Being allergic to: Medicines. Latex. Insects. Animals. Pollen. What are the signs or symptoms?  Raised, itchy, red or white bumps or patches on your skin. These areas may: Get large and swollen. Change in shape and location. Stand alone or connect to each other over a large area of skin. Sting or hurt. Turn white when pressed in the center (blanch). In very bad cases, your hands, feet, and face may also get swollen. This may happen if hives start deeper in your skin. How is this treated? Treatment for this condition depends on your symptoms. Treatment may include: Using cool, wet cloths (cool compresses) or taking cool showers to stop the itching. Medicines that help: Relieve itching (antihistamines). Reduce swelling (corticosteroids). Treat infection (antibiotics). A medicine (omalizumab) that is given as a shot (injection). Your  doctor may prescribe this if you have hives that do not get better even after other treatments. In very bad cases, you may need a shot of a medicine called epinephrine to prevent a life-threatening allergic reaction (anaphylaxis). Follow these instructions at home: Medicines Take or apply over-the-counter and prescription medicines only as told by your doctor. If you were prescribed an antibiotic medicine, use it as told by your doctor. Do not stop using it even if you start to feel better. Skin care Apply cool, wet cloths to the hives. Do not scratch your skin. Do not rub your skin. General instructions Do not take hot showers or baths. This can make itching worse. Do not wear tight clothes. Use sunscreen and wear clothes that cover your skin when you are outside. Avoid any triggers that cause your hives. Keep a journal to help track what causes your hives. Write down: What medicines you take. What you eat and drink. What products you use on your skin. Keep all follow-up visits as told by your doctor. This is important. Contact a doctor if: Your symptoms are not better with medicine. Your joints hurt or are swollen. Get help right away if: You have a fever. You have pain in your belly (abdomen). Your tongue or lips are swollen. Your eyelids are swollen. Your chest or throat feels tight. You have trouble breathing or swallowing. These symptoms may be an emergency. Do not wait to see if the symptoms will go away. Get medical help right away. Call your local emergency services (911 in the [place] Do not drive yourself to the hospital. Summary Hives are itchy, red, swollen areas on your skin. Treatment  for this condition depends on your symptoms. Avoid things that cause your hives. Keep a journal to help track what causes your hives. Take and apply over-the-counter and prescription medicines only as told by your doctor. Get help right away if your chest or throat feels tight or if you  have trouble breathing or swallowing. This information is not intended to replace advice given to you by your health care provider. Make sure you discuss any questions you have with your health care provider. Document Revised: 02/03/2021 Document Reviewed: 02/05/2021 Elsevier Patient Education  [place].

## 2022-08-17 ENCOUNTER — Other Ambulatory Visit: Payer: Self-pay | Admitting: Family Medicine

## 2022-09-19 ENCOUNTER — Other Ambulatory Visit: Payer: Self-pay | Admitting: Family Medicine

## 2022-09-24 ENCOUNTER — Encounter: Payer: Self-pay | Admitting: Family Medicine

## 2022-09-24 ENCOUNTER — Telehealth (INDEPENDENT_AMBULATORY_CARE_PROVIDER_SITE_OTHER): Payer: 59 | Admitting: Family Medicine

## 2022-09-24 VITALS — Ht 66.0 in | Wt 160.0 lb

## 2022-09-24 DIAGNOSIS — Z833 Family history of diabetes mellitus: Secondary | ICD-10-CM

## 2022-09-24 DIAGNOSIS — E039 Hypothyroidism, unspecified: Secondary | ICD-10-CM

## 2022-09-24 DIAGNOSIS — R635 Abnormal weight gain: Secondary | ICD-10-CM

## 2022-09-24 MED ORDER — WEGOVY 0.25 MG/0.5ML ~~LOC~~ SOAJ
0.2500 mg | SUBCUTANEOUS | 0 refills | Status: DC
Start: 2022-09-24 — End: 2022-11-06

## 2022-09-24 NOTE — Progress Notes (Signed)
Patient ID: Mallory Collins, female   DOB: 1976-12-29, 46 y.o.   MRN: [id number]   Virtual Visit via Video Note  I connected with Mallory Collins on 09/24/22 at  2:15 PM EDT by a video enabled telemedicine application and verified that I am speaking with the correct person using two identifiers.  Location patient: home Location provider:work or home office Persons participating in the virtual visit: patient, provider  I discussed the limitations of evaluation and management by telemedicine and the availability of in person appointments. The patient expressed understanding and agreed to proceed.   HPI:  Mallory Collins called to discuss possible weight loss medication.  She just unfortunately got some disturbing news about 10 minutes ago that her 71 year old sister has been diagnosed with severe congestive heart failure and may need heart transplant.  She is not sure regarding etiology of her sisters heart failure.  Mallory Collins relates that her weight for years was 130 pounds or less.  To maintain that weight she had to aim for calorie count 1200 to 1400 cal/day.  She recently went to GYN a week ago and had a weight of 162 pounds.  In looking back her weight was 157 pounds back in April and if we go back couple years ago weight was 140 pounds.  She denies any edema issues or any dyspnea.  She does think she occasionally stress eats but generally feels like her diet has been fairly stable.  She eats 2 meals per day and still aims for about 1200 cal/day.  She does have chronic fatigue issues which she has had for years which limits her exercise.  She had normal TSH back in April.  She does not have any history of diabetes but does have fairly strong family history in several members.  ROS: See pertinent positives and negatives per HPI.  Past Medical History:  Diagnosis Date   Agoraphobia    Asthma    Chronic fatigue    GAD (generalized anxiety disorder)    Hypothyroidism    MDD (major depressive disorder)     Panic attacks     Past Surgical History:  Procedure Laterality Date   WISDOM TOOTH EXTRACTION      Family History  Problem Relation Age of Onset   Hypertension Mother    Cancer Mother        breast   CAD Mother 5       CABG, stents   Heart disease Paternal Grandmother     SOCIAL HX: Non-smoker   Current Outpatient Medications:    albuterol (VENTOLIN HFA) 108 (90 Base) MCG/ACT inhaler, TAKE 2 PUFFS BY MOUTH EVERY 6 HOURS AS NEEDED FOR WHEEZE OR SHORTNESS OF BREATH, Disp: 8.5 each, Rfl: 1   cholecalciferol (VITAMIN D) 1000 units tablet, Take 1,000 Units by mouth daily., Disp: , Rfl:    ferrous gluconate (FERGON) 324 MG tablet, Take 1 tablet (324 mg total) by mouth daily with breakfast., Disp: 30 tablet, Rfl: 3   levothyroxine (SYNTHROID) 137 MCG tablet, TAKE 1 TABLET BY MOUTH DAILY, Disp: 90 tablet, Rfl: 0   LORazepam (ATIVAN) 0.5 MG tablet, Take 1 tablet (0.5 mg total) by mouth 3 (three) times daily., Disp: 90 tablet, Rfl: 0   Multiple Vitamin (MULTIVITAMIN) tablet, Take 1 tablet by mouth daily., Disp: , Rfl:    ondansetron (ZOFRAN) 4 MG tablet, TAKE 1 TABLET BY MOUTH EVERY 8 HOURS AS NEEDED FOR NAUSEA AND VOMITING, Disp: 9 tablet, Rfl: 0   propranolol (INDERAL) 10 MG tablet, TAKE  1 TABLET BY MOUTH EVERY DAY AS NEEDED, Disp: 90 tablet, Rfl: 0   Semaglutide-Weight Management (WEGOVY) 0.25 MG/0.5ML SOAJ, Inject 0.25 mg into the skin once a week., Disp: 6 mL, Rfl: 0   sertraline (ZOLOFT) 50 MG tablet, TAKE 1 TABLET BY MOUTH EVERY DAY, Disp: 90 tablet, Rfl: 0  EXAM:  VITALS per patient if applicable:  GENERAL: alert, oriented, appears well and in no acute distress  HEENT: atraumatic, conjunttiva clear, no obvious abnormalities on inspection of external nose and ears  NECK: normal movements of the head and neck  LUNGS: on inspection no signs of respiratory distress, breathing rate appears normal, no obvious gross SOB, gasping or wheezing  CV: no obvious cyanosis  MS:  moves all visible extremities without noticeable abnormality  PSYCH/NEURO: pleasant and cooperative, no obvious depression or anxiety, speech and thought processing grossly intact  ASSESSMENT AND PLAN:  Discussed the following assessment and plan:  Weight gain.  Her current BMI is right at 26.  Her past medical history is significant for supraventricular tachycardia, mild intermittent asthma, hypothyroidism on replacement, chronic fatigue, and fibromyalgia.  She specifically is inquiring regarding GLP-1 medications.  Her husband has diabetes and has had very good success with Mounjaro.  We explained that insurance coverage and availability could be challenges.  She does not have any contraindications.  She has struggled with very low calorie diets and struggles to exercise because of her chronic fatigue.  She is concerned about progression to diabetes and other complications.  She does not have any history of pancreatitis.  No family history of medullary thyroid cancer or multiple endocrine neoplasia.  We discussed possible side effects of GLP-1 medications especially nausea risk.  -We decided to try Wegovy 0.25 mg subcutaneous once weekly for 1 month and if tolerating well then titrate further to 0.5 mg.  She will give [place] some feedback in 1 month.  Will recommend office follow-up within a couple months to further assess      I discussed the assessment and treatment plan with the patient. The patient was provided an opportunity to ask questions and all were answered. The patient agreed with the plan and demonstrated an understanding of the instructions.   The patient was advised to call back or seek an in-person evaluation if the symptoms worsen or if the condition fails to improve as anticipated.     Carolann Littler, MD

## 2022-09-26 ENCOUNTER — Inpatient Hospital Stay: Payer: 59

## 2022-09-26 ENCOUNTER — Inpatient Hospital Stay: Payer: 59 | Admitting: Family

## 2022-09-27 ENCOUNTER — Encounter: Payer: Self-pay | Admitting: Family Medicine

## 2022-09-28 ENCOUNTER — Other Ambulatory Visit: Payer: Self-pay

## 2022-09-28 DIAGNOSIS — R635 Abnormal weight gain: Secondary | ICD-10-CM

## 2022-09-28 MED ORDER — TIRZEPATIDE 2.5 MG/0.5ML ~~LOC~~ SOAJ: 2.5000 mg | SUBCUTANEOUS | 1 refills | Status: DC

## 2022-10-17 ENCOUNTER — Inpatient Hospital Stay: Payer: 59 | Attending: Hematology & Oncology | Admitting: Family

## 2022-10-17 ENCOUNTER — Encounter: Payer: Self-pay | Admitting: Family

## 2022-10-17 ENCOUNTER — Inpatient Hospital Stay: Payer: 59

## 2022-10-17 DIAGNOSIS — E611 Iron deficiency: Secondary | ICD-10-CM | POA: Insufficient documentation

## 2022-10-17 DIAGNOSIS — D75839 Thrombocytosis, unspecified: Secondary | ICD-10-CM | POA: Diagnosis present

## 2022-10-17 DIAGNOSIS — D75838 Other thrombocytosis: Secondary | ICD-10-CM | POA: Diagnosis not present

## 2022-10-17 DIAGNOSIS — D500 Iron deficiency anemia secondary to blood loss (chronic): Secondary | ICD-10-CM | POA: Diagnosis not present

## 2022-10-17 DIAGNOSIS — D509 Iron deficiency anemia, unspecified: Secondary | ICD-10-CM | POA: Insufficient documentation

## 2022-10-17 LAB — RETICULOCYTES
Immature Retic Fract: 12 % (ref 2.3–15.9)
RBC.: 4.74 MIL/uL (ref 3.87–5.11)
Retic Count, Absolute: 106.2 10*3/uL (ref 19.0–186.0)
Retic Ct Pct: 2.2 % (ref 0.4–3.1)

## 2022-10-17 LAB — CBC WITH DIFFERENTIAL (CANCER CENTER ONLY)
Abs Immature Granulocytes: 0.03 10*3/uL (ref 0.00–0.07)
Basophils Absolute: 0 10*3/uL (ref 0.0–0.1)
Basophils Relative: 0 %
Eosinophils Absolute: 0 10*3/uL (ref 0.0–0.5)
Eosinophils Relative: 0 %
HCT: 41.7 % (ref 36.0–46.0)
Hemoglobin: 13.6 g/dL (ref 12.0–15.0)
Immature Granulocytes: 0 %
Lymphocytes Relative: 28 %
Lymphs Abs: 2.2 10*3/uL (ref 0.7–4.0)
MCH: 28.4 pg (ref 26.0–34.0)
MCHC: 32.6 g/dL (ref 30.0–36.0)
MCV: 87.1 fL (ref 80.0–100.0)
Monocytes Absolute: 0.8 10*3/uL (ref 0.1–1.0)
Monocytes Relative: 10 %
Neutro Abs: 5 10*3/uL (ref 1.7–7.7)
Neutrophils Relative %: 62 %
Platelet Count: 423 10*3/uL — ABNORMAL HIGH (ref 150–400)
RBC: 4.79 MIL/uL (ref 3.87–5.11)
RDW: 15.1 % (ref 11.5–15.5)
WBC Count: 8.1 10*3/uL (ref 4.0–10.5)
nRBC: 0 % (ref 0.0–0.2)

## 2022-10-17 LAB — FERRITIN: Ferritin: 7 ng/mL — ABNORMAL LOW (ref 11–307)

## 2022-10-17 NOTE — Progress Notes (Signed)
Hematology and Oncology Follow Up Visit  Mallory Collins [id number] 10-07-1976 46 y.o. 10/17/2022   Principle Diagnosis:  Iron deficiency with reactive mild thrombocytosis   Current Therapy:   Oral Jobe Igo) iron PO daily IV iron as indicated    Interim History:  Mallory Collins is here today with her husband for follow-up. She notes persistent fatigue and a dry cough.  Her cycles since starting [place] daily has been heavier but still regular.  No other blood loss noted. No bruising or petechiae.  She is also looking into IVF and was advised to move forward with platelet rich plasma infusion. From our standpoint this will be fine.  No fever, chills, n/v,rash, dizziness, SOB, chest pain, palpitations, abdominal pain or changes in bowel or bladder habits.   No swelling, tenderness, numbness or tingling in her extremities.  No falls or syncope.  Appetite and hydration are good. Weight is stable at 162 lbs.   ECOG Performance Status: 1 - Symptomatic but completely ambulatory  Medications:  Allergies as of 10/17/2022   No Known Allergies      Medication List        Accurate as of October 17, 2022 10:47 AM. If you have any questions, ask your nurse or doctor.          STOP taking these medications    sertraline 50 MG tablet Commonly known as: ZOLOFT Stopped by: Lottie Dawson, NP       TAKE these medications    albuterol 108 (90 Base) MCG/ACT inhaler Commonly known as: VENTOLIN HFA TAKE 2 PUFFS BY MOUTH EVERY 6 HOURS AS NEEDED FOR WHEEZE OR SHORTNESS OF BREATH   cholecalciferol 1000 units tablet Commonly known as: VITAMIN D Take 1,000 Units by mouth daily.   DULoxetine 60 MG capsule Commonly known as: CYMBALTA Take 60 mg by mouth daily.   ferrous gluconate 324 MG tablet Commonly known as: FERGON Take 1 tablet (324 mg total) by mouth daily with breakfast.   levothyroxine 137 MCG tablet Commonly known as: SYNTHROID TAKE 1 TABLET BY MOUTH DAILY   LORazepam  0.5 MG tablet Commonly known as: ATIVAN Take 1 tablet (0.5 mg total) by mouth 3 (three) times daily.   multivitamin tablet Take 1 tablet by mouth daily.   ondansetron 4 MG tablet Commonly known as: ZOFRAN TAKE 1 TABLET BY MOUTH EVERY 8 HOURS AS NEEDED FOR NAUSEA AND VOMITING   propranolol 10 MG tablet Commonly known as: INDERAL TAKE 1 TABLET BY MOUTH EVERY DAY AS NEEDED   tirzepatide 2.5 MG/0.5ML Pen Commonly known as: MOUNJARO Inject 2.5 mg into the skin once a week.   Wegovy 0.25 MG/0.5ML Soaj Generic drug: Semaglutide-Weight Management Inject 0.25 mg into the skin once a week.        Allergies: No Known Allergies  Past Medical History, Surgical history, Social history, and Family History were reviewed and updated.  Review of Systems: All other 10 point review of systems is negative.   Physical Exam:  vitals were not taken for this visit.   Wt Readings from Last 3 Encounters:  09/24/22 160 lb (72.6 kg)  06/26/22 158 lb 12.8 oz (72 kg)  06/08/22 157 lb 1.6 oz (71.3 kg)    Ocular: Sclerae unicteric, pupils equal, round and reactive to light Ear-nose-throat: Oropharynx clear, dentition fair Lymphatic: No cervical or supraclavicular adenopathy Lungs no rales or rhonchi, good excursion bilaterally Heart regular rate and rhythm, no murmur appreciated Abd soft, nontender, positive bowel sounds MSK no focal spinal tenderness,  no joint edema Neuro: non-focal, well-oriented, appropriate affect Breasts: Deferred   Lab Results  Component Value Date   WBC 8.1 10/17/2022   HGB 13.6 10/17/2022   HCT 41.7 10/17/2022   MCV 87.1 10/17/2022   PLT 423 (H) 10/17/2022   Lab Results  Component Value Date   FERRITIN 4 (L) 06/26/2022   IRON 51 06/26/2022   TIBC 435 06/26/2022   UIBC 384 06/26/2022   IRONPCTSAT 12 06/26/2022   Lab Results  Component Value Date   RETICCTPCT 2.2 10/17/2022   RBC 4.79 10/17/2022   RBC 4.74 10/17/2022   No results found for:  "KPAFRELGTCHN", "LAMBDASER", "KAPLAMBRATIO" No results found for: "IGGSERUM", "IGA", "IGMSERUM" No results found for: "TOTALPROTELP", "ALBUMINELP", "A1GS", "A2GS", "BETS", "BETA2SER", "GAMS", "MSPIKE", "SPEI"   Chemistry      Component Value Date/Time   NA 141 06/26/2022 1420   K 3.9 06/26/2022 1420   CL 103 06/26/2022 1420   CO2 30 06/26/2022 1420   BUN 8 06/26/2022 1420   CREATININE 0.81 06/26/2022 1420      Component Value Date/Time   CALCIUM 9.5 06/26/2022 1420   ALKPHOS 78 06/26/2022 1420   AST 14 (L) 06/26/2022 1420   ALT 6 06/26/2022 1420   BILITOT 0.4 06/26/2022 1420       Impression and Plan: Mallory Collins is a very pleasant 46 yo caucasian female for mild thrombocytosis secondary to iron deficiency.  Iron studies ar pending.  We will proceed with IV iron if needed.  Follow-up in 3 months.   Lottie Dawson, NP 10/18/202310:47 AM

## 2022-10-18 LAB — IRON AND IRON BINDING CAPACITY (CC-WL,HP ONLY)
Iron: 45 ug/dL (ref 28–170)
Saturation Ratios: 11 % (ref 10.4–31.8)
TIBC: 396 ug/dL (ref 250–450)
UIBC: 351 ug/dL (ref 148–442)

## 2022-10-19 ENCOUNTER — Other Ambulatory Visit: Payer: Self-pay | Admitting: Family

## 2022-10-20 ENCOUNTER — Other Ambulatory Visit: Payer: Self-pay | Admitting: Adult Health

## 2022-10-20 ENCOUNTER — Other Ambulatory Visit: Payer: Self-pay | Admitting: Family Medicine

## 2022-10-20 DIAGNOSIS — F4000 Agoraphobia, unspecified: Secondary | ICD-10-CM

## 2022-10-20 DIAGNOSIS — F411 Generalized anxiety disorder: Secondary | ICD-10-CM

## 2022-10-20 DIAGNOSIS — F410 Panic disorder [episodic paroxysmal anxiety] without agoraphobia: Secondary | ICD-10-CM

## 2022-10-22 ENCOUNTER — Encounter (HOSPITAL_BASED_OUTPATIENT_CLINIC_OR_DEPARTMENT_OTHER): Payer: Self-pay

## 2022-10-22 ENCOUNTER — Other Ambulatory Visit: Payer: Self-pay

## 2022-10-22 ENCOUNTER — Emergency Department (HOSPITAL_BASED_OUTPATIENT_CLINIC_OR_DEPARTMENT_OTHER)
Admission: EM | Admit: 2022-10-22 | Discharge: 2022-10-22 | Disposition: A | Discharge status: Home / Self Care | Payer: 59 | Attending: Emergency Medicine | Admitting: Emergency Medicine

## 2022-10-22 DIAGNOSIS — R5383 Other fatigue: Secondary | ICD-10-CM | POA: Insufficient documentation

## 2022-10-22 DIAGNOSIS — D75839 Thrombocytosis, unspecified: Secondary | ICD-10-CM | POA: Insufficient documentation

## 2022-10-22 LAB — CBC WITH DIFFERENTIAL/PLATELET
Abs Immature Granulocytes: 0.02 10*3/uL (ref 0.00–0.07)
Basophils Absolute: 0 10*3/uL (ref 0.0–0.1)
Basophils Relative: 0 %
Eosinophils Absolute: 0 10*3/uL (ref 0.0–0.5)
Eosinophils Relative: 0 %
HCT: 43 % (ref 36.0–46.0)
Hemoglobin: 13.8 g/dL (ref 12.0–15.0)
Immature Granulocytes: 0 %
Lymphocytes Relative: 22 %
Lymphs Abs: 2 10*3/uL (ref 0.7–4.0)
MCH: 28 pg (ref 26.0–34.0)
MCHC: 32.1 g/dL (ref 30.0–36.0)
MCV: 87.4 fL (ref 80.0–100.0)
Monocytes Absolute: 0.8 10*3/uL (ref 0.1–1.0)
Monocytes Relative: 9 %
Neutro Abs: 6.3 10*3/uL (ref 1.7–7.7)
Neutrophils Relative %: 69 %
Platelets: 417 10*3/uL — ABNORMAL HIGH (ref 150–400)
RBC: 4.92 MIL/uL (ref 3.87–5.11)
RDW: 15.6 % — ABNORMAL HIGH (ref 11.5–15.5)
WBC: 9.2 10*3/uL (ref 4.0–10.5)
nRBC: 0 % (ref 0.0–0.2)

## 2022-10-22 LAB — BASIC METABOLIC PANEL
Anion gap: 5 (ref 5–15)
BUN: 8 mg/dL (ref 6–20)
CO2: 29 mmol/L (ref 22–32)
Calcium: 8.9 mg/dL (ref 8.9–10.3)
Chloride: 107 mmol/L (ref 98–111)
Creatinine, Ser: 0.75 mg/dL (ref 0.44–1.00)
GFR, Estimated: >60 mL/min (ref >60)
Glucose, Bld: 111 mg/dL — ABNORMAL HIGH (ref 70–99)
Potassium: 4.3 mmol/L (ref 3.5–5.1)
Sodium: 141 mmol/L (ref 135–145)

## 2022-10-22 NOTE — ED Provider Notes (Signed)
[facility]ENC[facility] DEPARTMENT Provider Note   CSN: [id number] Arrival date & time: 10/22/22  1323     History  Chief Complaint  Patient presents with   Fatigue    Mallory Collins is a 46 y.o. female.  HPI   Patient presents to the ED for evaluation of fatigue.  Patient states she started her menstrual period yesterday.  It is heavier than usual.  This is also irregular and it is the second 1 this month.  Patient states she is feeling very tired and fatigued.  She has noticed an irregular menstrual periods over the last couple months.  She denies having any vomiting or diarrhea.  No abdominal pain.  No fevers or chills.  No cough or sore throat.  Home Medications Prior to Admission medications   Medication Sig Start Date End Date Taking? Authorizing Provider  albuterol (VENTOLIN HFA) 108 (90 Base) MCG/ACT inhaler TAKE 2 PUFFS BY MOUTH EVERY 6 HOURS AS NEEDED FOR WHEEZE OR SHORTNESS OF BREATH 02/12/22   Burchette, Alinda Sierras, MD  cholecalciferol (VITAMIN D) 1000 units tablet Take 1,000 Units by mouth daily.    [provider]  DULoxetine (CYMBALTA) 60 MG capsule Take 60 mg by mouth daily.    [provider]  ferrous gluconate (FERGON) 324 MG tablet Take 1 tablet (324 mg total) by mouth daily with breakfast. 06/27/22   Celso Amy, NP  levothyroxine (SYNTHROID) 137 MCG tablet TAKE 1 TABLET BY MOUTH DAILY 09/19/22   Burchette, Alinda Sierras, MD  LORazepam (ATIVAN) 0.5 MG tablet Take 1 tablet (0.5 mg total) by mouth 3 (three) times daily. 06/12/22   Mozingo, Berdie Ogren, NP  Multiple Vitamin (MULTIVITAMIN) tablet Take 1 tablet by mouth daily.    [provider]  ondansetron (ZOFRAN) 4 MG tablet TAKE 1 TABLET BY MOUTH EVERY 8 HOURS AS NEEDED FOR NAUSEA AND VOMITING 10/22/22   Burchette, Alinda Sierras, MD  propranolol (INDERAL) 10 MG tablet TAKE 1 TABLET BY MOUTH EVERY DAY AS NEEDED 04/19/22   Burchette, Alinda Sierras, MD  Semaglutide-Weight Management (WEGOVY)  0.25 MG/0.5ML SOAJ Inject 0.25 mg into the skin once a week. Patient not taking: Reported on 10/17/2022 09/24/22   Eulas Post, MD  tirzepatide [facility]) 2.5 MG/0.5ML Pen Inject 2.5 mg into the skin once a week. Patient not taking: Reported on 10/17/2022 09/28/22   Eulas Post, MD      Allergies    Patient has no known allergies.    Review of Systems   Review of Systems  Physical Exam Updated Vital Signs BP (!) 160/86 (BP Location: Right Arm)   Pulse 93   Temp 98.4 F (36.9 C) (Oral)   Resp 20   Ht 1.676 m ('5\' 6"'$ )   Wt 72.6 kg   LMP 10/21/2022 (Exact Date)   SpO2 99%   BMI 25.82 kg/m  Physical Exam Vitals and nursing note reviewed.  Constitutional:      General: She is not in acute distress.    Appearance: She is well-developed.  HENT:     Head: Normocephalic and atraumatic.     Right Ear: External ear normal.     Left Ear: External ear normal.  Eyes:     General: No scleral icterus.       Right eye: No discharge.        Left eye: No discharge.     Conjunctiva/sclera: Conjunctivae normal.  Neck:     Trachea: No tracheal deviation.  Cardiovascular:  Rate and Rhythm: Normal rate and regular rhythm.  Pulmonary:     Effort: Pulmonary effort is normal. No respiratory distress.     Breath sounds: Normal breath sounds. No stridor. No wheezing or rales.  Abdominal:     General: Bowel sounds are normal. There is no distension.     Palpations: Abdomen is soft.     Tenderness: There is no abdominal tenderness. There is no guarding or rebound.  Musculoskeletal:        General: No tenderness or deformity.     Cervical back: Neck supple.  Skin:    General: Skin is warm and dry.     Findings: No rash.  Neurological:     General: No focal deficit present.     Mental Status: She is alert.     Cranial Nerves: No cranial nerve deficit (no facial droop, extraocular movements intact, no slurred speech).     Sensory: No sensory deficit.     Motor: No abnormal  muscle tone or seizure activity.     Coordination: Coordination normal.  Psychiatric:        Mood and Affect: Mood normal.     ED Results / Procedures / Treatments   Labs (all labs ordered are listed, but only abnormal results are displayed) Labs Reviewed  CBC WITH DIFFERENTIAL/PLATELET - Abnormal; Notable for the following components:      Result Value   RDW 15.6 (*)    Platelets 417 (*)    All other components within normal limits  BASIC METABOLIC PANEL - Abnormal; Notable for the following components:   Glucose, Bld 111 (*)    All other components within normal limits    EKG EKG Interpretation  Date/Time:  Monday October 22 2022 13:35:40 EDT Ventricular Rate:  91 PR Interval:  144 QRS Duration: 72 QT Interval:  334 QTC Calculation: 410 R Axis:   37 Text Interpretation: Normal sinus rhythm Normal ECG No old tracing to compare Confirmed by Deno Etienne [phone]) on 10/22/2022 2:46:04 PM  Radiology No results found.  Procedures Procedures    Medications Ordered in ED Medications - No data to display  ED Course/ Medical Decision Making/ A&P Clinical Course as of 10/22/22 1554  Mon Oct 22, 2022  1552 Thrombocytosis noted but this is similar to baseline.  No anemia noted today. [JK]  8250 Basic metabolic panel(!) Normal except for slight increase in glucose that is not clinically significant [JK]  1553 Discussed doing a pregnancy test however patient states there is no way she could be pregnant and does not want to have that test done at this time. [JK]    Clinical Course User Index [JK] Dorie Rank, MD                           Medical Decision Making Amount and/or Complexity of Data Reviewed External Data Reviewed: labs.    Details: Previous records reviewed.  Patient did have a thyroid test 6 months ago.  Reviewed her previous labs Labs: ordered.   Patient presented ED for evaluation of fatigue.  ED work-up is reassuring.  She is not anemic.  She is not  dehydrated.  She is not having any fevers or chills.  Patient also mentioned having some palpitations.  EKG shows normal sinus rhythm.  Is possible that menses may be making her feel fatigued but no signs of any emergent etiology at this time.  Patient appears appropriate for outpatient follow-up with  her oncologist who she sees for her iron deficiency anemia        Final Clinical Impression(s) / ED Diagnoses Final diagnoses:  Other fatigue    Rx / DC Orders ED Discharge Orders     None         Dorie Rank, MD 10/22/22 1554

## 2022-10-22 NOTE — ED Triage Notes (Signed)
Hx of low iron, states started period yesterday and has been very heavy. States feel extremely weak, heart palpitations & appears pale.

## 2022-10-22 NOTE — Discharge Instructions (Signed)
Follow-up with your primary doctor and/or oncologist for further evaluation.  Follow-up with your OB/GYN doctor regarding your irregular menstrual periods.

## 2022-10-22 NOTE — Telephone Encounter (Signed)
Please schedule appt

## 2022-10-22 NOTE — Telephone Encounter (Signed)
Last office visit/refill 05/23/22

## 2022-10-24 ENCOUNTER — Telehealth: Payer: Self-pay | Admitting: *Deleted

## 2022-10-24 NOTE — Telephone Encounter (Signed)
Per scheduling message Sarah - called and lvm for a callback to schedule (3) doses of IV Iron 

## 2022-10-25 ENCOUNTER — Telehealth: Payer: Self-pay | Admitting: *Deleted

## 2022-10-25 NOTE — Telephone Encounter (Signed)
LVM to schedule

## 2022-10-25 NOTE — Telephone Encounter (Signed)
Please call patient to schedule an appt, past due.

## 2022-10-25 NOTE — Telephone Encounter (Signed)
Per 10/17/22 los - called and lvm of upcoming appointments - requested callback to confirm

## 2022-10-29 ENCOUNTER — Encounter: Payer: Self-pay | Admitting: Family

## 2022-10-30 ENCOUNTER — Inpatient Hospital Stay: Payer: 59

## 2022-10-30 ENCOUNTER — Other Ambulatory Visit: Payer: Self-pay

## 2022-10-30 ENCOUNTER — Other Ambulatory Visit: Payer: Self-pay | Admitting: Adult Health

## 2022-10-30 VITALS — BP 116/69 | HR 82 | Temp 97.7°F | Resp 17

## 2022-10-30 DIAGNOSIS — D75838 Other thrombocytosis: Secondary | ICD-10-CM | POA: Diagnosis not present

## 2022-10-30 DIAGNOSIS — F410 Panic disorder [episodic paroxysmal anxiety] without agoraphobia: Secondary | ICD-10-CM

## 2022-10-30 DIAGNOSIS — F4000 Agoraphobia, unspecified: Secondary | ICD-10-CM

## 2022-10-30 DIAGNOSIS — F411 Generalized anxiety disorder: Secondary | ICD-10-CM

## 2022-10-30 DIAGNOSIS — D500 Iron deficiency anemia secondary to blood loss (chronic): Secondary | ICD-10-CM

## 2022-10-30 MED ORDER — SODIUM CHLORIDE 0.9% FLUSH: 10.0000 mL | Freq: Once | INTRAVENOUS | Status: DC | PRN

## 2022-10-30 MED ORDER — SODIUM CHLORIDE 0.9 % IV SOLN
300.0000 mg | Freq: Once | INTRAVENOUS | Status: AC
  Administered 2022-10-30: 300 mg via INTRAVENOUS
  Filled 2022-10-30: qty 300

## 2022-10-30 MED ORDER — SODIUM CHLORIDE 0.9 % IV SOLN: Freq: Once | INTRAVENOUS | Status: AC

## 2022-10-30 MED ORDER — HEPARIN SOD (PORK) LOCK FLUSH 100 UNIT/ML IV SOLN: 500.0000 [IU] | Freq: Once | INTRAVENOUS | Status: DC | PRN

## 2022-10-30 MED ORDER — LORAZEPAM 0.5 MG PO TABS: 0.5000 mg | ORAL_TABLET | Freq: Three times a day (TID) | ORAL | 0 refills | Status: DC

## 2022-10-30 MED ORDER — LORAZEPAM 0.5 MG PO TABS: 0.5000 mg | ORAL_TABLET | Freq: Two times a day (BID) | ORAL | 0 refills | Status: DC

## 2022-10-30 MED ORDER — HEPARIN SOD (PORK) LOCK FLUSH 100 UNIT/ML IV SOLN: 250.0000 [IU] | Freq: Once | INTRAVENOUS | Status: DC | PRN

## 2022-10-30 MED ORDER — SODIUM CHLORIDE 0.9% FLUSH: 3.0000 mL | Freq: Once | INTRAVENOUS | Status: DC | PRN

## 2022-10-30 MED ORDER — ALTEPLASE 2 MG IJ SOLR: 2.0000 mg | Freq: Once | INTRAMUSCULAR | Status: DC | PRN

## 2022-10-30 NOTE — Patient Instructions (Signed)

## 2022-11-01 ENCOUNTER — Telehealth (INDEPENDENT_AMBULATORY_CARE_PROVIDER_SITE_OTHER): Payer: 59 | Admitting: Adult Health

## 2022-11-01 ENCOUNTER — Encounter: Payer: Self-pay | Admitting: Adult Health

## 2022-11-01 DIAGNOSIS — F410 Panic disorder [episodic paroxysmal anxiety] without agoraphobia: Secondary | ICD-10-CM

## 2022-11-01 DIAGNOSIS — F331 Major depressive disorder, recurrent, moderate: Secondary | ICD-10-CM

## 2022-11-01 DIAGNOSIS — F4000 Agoraphobia, unspecified: Secondary | ICD-10-CM

## 2022-11-01 DIAGNOSIS — F411 Generalized anxiety disorder: Secondary | ICD-10-CM

## 2022-11-01 NOTE — Progress Notes (Signed)
Mallory Collins [id number] 22-Dec-1976 46 y.o.  Virtual Visit via Video Note  I connected with pt @ on 11/01/22 at  3:20 PM EDT by a video enabled telemedicine application and verified that I am speaking with the correct person using two identifiers.   I discussed the limitations of evaluation and management by telemedicine and the availability of in person appointments. The patient expressed understanding and agreed to proceed.  I discussed the assessment and treatment plan with the patient. The patient was provided an opportunity to ask questions and all were answered. The patient agreed with the plan and demonstrated an understanding of the instructions.   The patient was advised to call back or seek an in-person evaluation if the symptoms worsen or if the condition fails to improve as anticipated.  I provided 25 minutes of non-face-to-face time during this encounter.  The patient was located at home.  The provider was located at [place].   Aloha Gell, NP   Subjective:   Patient ID:  Mallory Collins is a 46 y.o. (DOB Oct 17, 1976) female.  Chief Complaint: No chief complaint on file.   HPI Mallory Collins presents for follow-up of GAD, MDD, panic attacks, and agoraphobia.   Describes mood today as "ok". Denies tearfulness. Mood symptoms - denies depression and irritability. Reports increased anxiety at times - doesn't do well when anxiety is increased. Denies worry and rumination - doesn't do well with changes. Reports panic attacks - one in the last month. Mood is consistent. Stating "I'm doing alright". Lost her father and father in law this year. Sister was in the hospital for heart failure - doing better. Stable interest and motivation. Taking medications as prescribed.  Energy levels lower - receiving iron infusions. Active, does not have a regular exercise routine.  Enjoys some usual interests and activities. Married. Lives with husband and mother -  2 dogs. Spending time with family. Appetite adequate. Weight gain - 160 to 165 pounds.  Sleeps well most nights. Averages 9 - 10 hours. Focus and concentration stable. Completing tasks. Managing aspects of household. Unable to work - Fibromyalgia. Denies SI or HI.  Denies AH or VH.  Previous medication trials: Clonazepam - too sedating, Zoloft  Review of Systems:  Review of Systems  Musculoskeletal:  Negative for gait problem.  Neurological:  Negative for tremors.  Psychiatric/Behavioral:         Please refer to HPI    Medications: I have reviewed the patient's current medications.  Current Outpatient Medications  Medication Sig Dispense Refill   albuterol (VENTOLIN HFA) 108 (90 Base) MCG/ACT inhaler TAKE 2 PUFFS BY MOUTH EVERY 6 HOURS AS NEEDED FOR WHEEZE OR SHORTNESS OF BREATH 8.5 each 1   cholecalciferol (VITAMIN D) 1000 units tablet Take 1,000 Units by mouth daily.     DULoxetine (CYMBALTA) 60 MG capsule Take 60 mg by mouth daily.     ferrous gluconate (FERGON) 324 MG tablet Take 1 tablet (324 mg total) by mouth daily with breakfast. 30 tablet 3   levothyroxine (SYNTHROID) 137 MCG tablet TAKE 1 TABLET BY MOUTH DAILY 90 tablet 0   LORazepam (ATIVAN) 0.5 MG tablet Take 1 tablet (0.5 mg total) by mouth 2 (two) times daily. 60 tablet 0   Multiple Vitamin (MULTIVITAMIN) tablet Take 1 tablet by mouth daily.     ondansetron (ZOFRAN) 4 MG tablet TAKE 1 TABLET BY MOUTH EVERY 8 HOURS AS NEEDED FOR NAUSEA AND VOMITING 9 tablet 0   propranolol (INDERAL) 10  MG tablet TAKE 1 TABLET BY MOUTH EVERY DAY AS NEEDED 90 tablet 0   Semaglutide-Weight Management (WEGOVY) 0.25 MG/0.5ML SOAJ Inject 0.25 mg into the skin once a week. (Patient not taking: Reported on 10/17/2022) 6 mL 0   tirzepatide (MOUNJARO) 2.5 MG/0.5ML Pen Inject 2.5 mg into the skin once a week. (Patient not taking: Reported on 10/17/2022) 2 mL 1   No current facility-administered medications for this visit.    Medication Side  Effects: None  Allergies: No Known Allergies  Past Medical History:  Diagnosis Date   Agoraphobia    Asthma    Chronic fatigue    GAD (generalized anxiety disorder)    Hypothyroidism    MDD (major depressive disorder)    Panic attacks     Family History  Problem Relation Age of Onset   Hypertension Mother    Cancer Mother        breast   CAD Mother 50       CABG, stents   Heart disease Paternal Grandmother     Social History   Socioeconomic History   Marital status: Married    Spouse name: Not on file   Number of children: Not on file   Years of education: Not on file   Highest education level: Not on file  Occupational History   Not on file  Tobacco Use   Smoking status: Never   Smokeless tobacco: Never  Substance and Sexual Activity   Alcohol use: Never   Drug use: Never   Sexual activity: Not on file  Other Topics Concern   Not on file  Social History Narrative   Not on file   Social Determinants of Health   Financial Resource Strain: Not on file  Food Insecurity: Not on file  Transportation Needs: Not on file  Physical Activity: Not on file  Stress: Not on file  Social Connections: Not on file  Intimate Partner Violence: Not on file    Past Medical History, Surgical history, Social history, and Family history were reviewed and updated as appropriate.   Please see review of systems for further details on the patient's review from today.   Objective:   Physical Exam:  LMP 10/21/2022 (Exact Date)   Physical Exam Constitutional:      General: She is not in acute distress. Musculoskeletal:        General: No deformity.  Neurological:     Mental Status: She is alert and oriented to person, place, and time.     Coordination: Coordination normal.  Psychiatric:        Attention and Perception: Attention and perception normal. She does not perceive auditory or visual hallucinations.        Mood and Affect: Mood normal. Mood is not anxious or  depressed. Affect is not labile, blunt, angry or inappropriate.        Speech: Speech normal.        Behavior: Behavior normal.        Thought Content: Thought content normal. Thought content is not paranoid or delusional. Thought content does not include homicidal or suicidal ideation. Thought content does not include homicidal or suicidal plan.        Cognition and Memory: Cognition and memory normal.        Judgment: Judgment normal.     Comments: Insight intact     Lab Review:     Component Value Date/Time   NA 141 10/22/2022 1337   K 4.3 10/22/2022 1337  CL 107 10/22/2022 1337   CO2 29 10/22/2022 1337   GLUCOSE 111 (H) 10/22/2022 1337   BUN 8 10/22/2022 1337   CREATININE 0.75 10/22/2022 1337   CREATININE 0.81 06/26/2022 1420   CALCIUM 8.9 10/22/2022 1337   PROT 7.2 06/26/2022 1420   ALBUMIN 4.3 06/26/2022 1420   AST 14 (L) 06/26/2022 1420   ALT 6 06/26/2022 1420   ALKPHOS 78 06/26/2022 1420   BILITOT 0.4 06/26/2022 1420   GFRNONAA >60 10/22/2022 1337   GFRNONAA >60 06/26/2022 1420   GFRAA  05/22/2010 0100    >60        The eGFR has been calculated using the MDRD equation. This calculation has not been validated in all clinical situations. eGFR's persistently <60 mL/min signify possible Chronic Kidney Disease.       Component Value Date/Time   WBC 9.2 10/22/2022 1337   RBC 4.92 10/22/2022 1337   HGB 13.8 10/22/2022 1337   HGB 13.6 10/17/2022 1019   HCT 43.0 10/22/2022 1337   PLT 417 (H) 10/22/2022 1337   PLT 423 (H) 10/17/2022 1019   MCV 87.4 10/22/2022 1337   MCH 28.0 10/22/2022 1337   MCHC 32.1 10/22/2022 1337   RDW 15.6 (H) 10/22/2022 1337   LYMPHSABS 2.0 10/22/2022 1337   MONOABS 0.8 10/22/2022 1337   EOSABS 0.0 10/22/2022 1337   BASOSABS 0.0 10/22/2022 1337    No results found for: "POCLITH", "LITHIUM"   No results found for: "PHENYTOIN", "PHENOBARB", "VALPROATE", "CBMZ"   .res Assessment: Plan:    Plan:  PDMP reviewed  1. Ativan 0.63m  daily to BID as needed for anxiety/agoraphobia  Taking Cymbalta - 661mdaily for fibromyalgia  RTC 3 months   Patient advised to contact office with any questions, adverse effects, or acute worsening in signs and symptoms.  Discussed potential benefits, risk, and side effects of benzodiazepines to include potential risk of tolerance and dependence, as well as possible drowsiness.  Advised patient not to drive if experiencing drowsiness and to take lowest possible effective dose to minimize risk of dependence and tolerance.  Diagnoses and all orders for this visit:  Major depressive disorder, recurrent episode, moderate (HCC)  Generalized anxiety disorder  Panic attacks  Agoraphobia     Please see After Visit Summary for patient specific instructions.  Future Appointments  Date Time Provider [place]11/06/2022 11:00 AM [facility] B3 [facility] None  11/13/2022 11:00 AM [facility] B3 [facility] None  01/18/2023  1:30 PM [facility] LAB [facility] None  01/18/2023  2:00 PM CaCelso AmyNP [facility] None    No orders of the defined types were placed in this encounter.     -------------------------------

## 2022-11-06 ENCOUNTER — Inpatient Hospital Stay: Payer: 59 | Attending: Hematology & Oncology

## 2022-11-06 VITALS — BP 141/73 | HR 75 | Temp 98.0°F | Resp 18

## 2022-11-06 DIAGNOSIS — Z79899 Other long term (current) drug therapy: Secondary | ICD-10-CM | POA: Insufficient documentation

## 2022-11-06 DIAGNOSIS — D500 Iron deficiency anemia secondary to blood loss (chronic): Secondary | ICD-10-CM | POA: Insufficient documentation

## 2022-11-06 MED ORDER — SODIUM CHLORIDE 0.9 % IV SOLN: Freq: Once | INTRAVENOUS | Status: AC

## 2022-11-06 MED ORDER — SODIUM CHLORIDE 0.9 % IV SOLN
300.0000 mg | Freq: Once | INTRAVENOUS | Status: AC
  Administered 2022-11-06: 300 mg via INTRAVENOUS
  Filled 2022-11-06: qty 300

## 2022-11-06 NOTE — Patient Instructions (Signed)

## 2022-11-13 ENCOUNTER — Inpatient Hospital Stay: Payer: 59

## 2022-11-13 VITALS — BP 130/87 | HR 85 | Temp 98.6°F | Resp 18

## 2022-11-13 DIAGNOSIS — D500 Iron deficiency anemia secondary to blood loss (chronic): Secondary | ICD-10-CM

## 2022-11-13 MED ORDER — SODIUM CHLORIDE 0.9 % IV SOLN
300.0000 mg | Freq: Once | INTRAVENOUS | Status: AC
  Administered 2022-11-13: 300 mg via INTRAVENOUS
  Filled 2022-11-13: qty 300

## 2022-11-13 MED ORDER — SODIUM CHLORIDE 0.9 % IV SOLN: Freq: Once | INTRAVENOUS | Status: AC

## 2022-11-13 NOTE — Patient Instructions (Signed)

## 2022-11-13 NOTE — Progress Notes (Signed)
Patient does not want to stay for the recommended 30 minute post IV iron observation. Patient discharged ambulatory without complaints or concerns.

## 2022-11-27 ENCOUNTER — Ambulatory Visit: Payer: 59 | Admitting: Family Medicine

## 2022-11-28 ENCOUNTER — Ambulatory Visit (INDEPENDENT_AMBULATORY_CARE_PROVIDER_SITE_OTHER): Payer: 59 | Admitting: Family Medicine

## 2022-11-28 ENCOUNTER — Telehealth: Payer: Self-pay

## 2022-11-28 VITALS — BP 120/80 | HR 81 | Temp 98.5°F | Wt 164.0 lb

## 2022-11-28 DIAGNOSIS — R29898 Other symptoms and signs involving the musculoskeletal system: Secondary | ICD-10-CM

## 2022-11-28 DIAGNOSIS — R5382 Chronic fatigue, unspecified: Secondary | ICD-10-CM

## 2022-11-28 DIAGNOSIS — R202 Paresthesia of skin: Secondary | ICD-10-CM | POA: Diagnosis not present

## 2022-11-28 DIAGNOSIS — R051 Acute cough: Secondary | ICD-10-CM | POA: Diagnosis not present

## 2022-11-28 NOTE — Patient Instructions (Signed)
I will be setting up MRI brain to further assess.

## 2022-11-28 NOTE — Telephone Encounter (Signed)
--  Caller states she has been experiencing fatigue for several weeks. Patient states she has a cold for the past two weeks, pt is having nasal congestion, sinus pressure, stuffed up ears.  11/26/2022 3:03:22 PM See HCP within 4 Hours (or PCP triage) Tito Dine, RN, Deborah  Comments User: Nelwyn Salisbury, RN Date/Time Eilene Ghazi Time): 11/26/2022 3:08:10 PM Called backline and transferred pt to schedule an appt.  Referrals REFERRED TO PCP OFFICE  Pt canceled appt with PCP that was scheduled for 11/28. Pt rescheduled for 11/28/22

## 2022-11-28 NOTE — Progress Notes (Unsigned)
Established Patient Office Visit  Subjective   Patient ID: Mallory Collins, female    DOB: Sep 10, 1976  Age: 46 y.o. MRN: [id number]  Chief Complaint  Patient presents with   Fatigue    Ongoing;x 4 weeks has been doing iron infusion, last infusion 2 weeks ago yesterday.   muscle weakness    Scalp going to sleep    HPI  {History (Optional):23778} Mallory Collins is here to discuss ongoing fatigue issues.  She states she had fatigue she states she had fatigue since around age 23 has had extensive work-up previously.  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  She does feel her fatigue symptoms have progressed recently.  Usually sleeps at least 10 hours/day and usually sound sleep.  Wakes up feeling exhausted.  She notes that with minimal activity including minimal housework feels extremely weak generally.  Denies any consistent orthostatic symptoms.  Really not able to exercise much because of her weakness and fatigue  She does have history of iron deficiency has been followed by hematology and getting iron infusions.  She has had a low ferritin with normal hemoglobin.  She does complain of 2 to 3-week history of intermittent right scalp paresthesias.  No headaches.  Also has noticed several weeks if not months of some progressive left arm weakness.  She is right-hand dominant.  Denies any upper or lower extremity paresthesias.  No urine or stool incontinence.  No recent visual changes.  No speech changes.  Past Medical History:  Diagnosis Date   Agoraphobia    Asthma    Chronic fatigue    GAD (generalized anxiety disorder)    Hypothyroidism    MDD (major depressive disorder)    Panic attacks    Past Surgical History:  Procedure Laterality Date   WISDOM TOOTH EXTRACTION      reports that she has never smoked. She has never used smokeless tobacco. She reports that she does not drink alcohol and does not  use drugs. family history includes CAD (age of onset: 47) in her mother; Cancer in her mother; Heart disease in her paternal grandmother; Hypertension in her mother. No Known Allergies   Review of Systems  Constitutional:  Positive for malaise/fatigue. Negative for chills, fever and weight loss.  Eyes:  Negative for blurred vision and double vision.  Respiratory:  Negative for shortness of breath.   Gastrointestinal:  Negative for abdominal pain, nausea and vomiting.  Genitourinary:  Negative for dysuria.  Neurological:  Positive for sensory change and focal weakness. Negative for dizziness, speech change, seizures, loss of consciousness and headaches.  Psychiatric/Behavioral:  Negative for depression.       Objective:     BP 120/80 (BP Location: Left Arm, Patient Position: Sitting, Cuff Size: Normal)   Pulse 81   Temp 98.5 F (36.9 C) (Oral)   Wt 164 lb (74.4 kg)   SpO2 96%   BMI 26.47 kg/m  BP Readings from Last 3 Encounters:  11/28/22 120/80  11/13/22 130/87  11/06/22 (!) 141/73   Wt Readings from Last 3 Encounters:  11/28/22 164 lb (74.4 kg)  10/22/22 160 lb (72.6 kg)  09/24/22 160 lb (72.6 kg)      Physical Exam Vitals reviewed.  Constitutional:      General: She is not in acute distress.    Appearance: Normal appearance. She is not toxic-appearing.  HENT:     Head: Normocephalic and atraumatic.  Eyes:     Extraocular Movements: Extraocular movements intact.     Pupils: Pupils are equal, round, and reactive to light.  Cardiovascular:     Rate and Rhythm: Normal rate and regular rhythm.     Heart sounds: No murmur heard.    No gallop.  Pulmonary:     Effort: Pulmonary effort is normal.     Breath sounds: Normal breath sounds. No wheezing or rales.  Musculoskeletal:     Cervical back: Neck supple.     Right lower leg: No edema.     Left lower leg: No edema.  Lymphadenopathy:     Cervical: No cervical adenopathy.  Neurological:     Mental Status: She  is alert.     Cranial Nerves: No cranial nerve deficit.     Gait: Gait normal.     Comments: She has some mild weakness left upper extremity compared to the right.  Subjectively decreased sensation right side of scalp compared with left.  Intact though with monofilament testing.  Cranial nerves II through XII intact.  No ataxia with finger-nose testing      No results found for any visits on 11/28/22.  Last CBC Lab Results  Component Value Date   WBC 9.2 10/22/2022   HGB 13.8 10/22/2022   HCT 43.0 10/22/2022   MCV 87.4 10/22/2022   MCH 28.0 10/22/2022   RDW 15.6 (H) 10/22/2022   PLT 417 (H) 35/00/9381   Last metabolic panel Lab Results  Component Value Date   GLUCOSE 111 (H) 10/22/2022   NA 141 10/22/2022   K 4.3 10/22/2022   CL 107 10/22/2022   CO2 29 10/22/2022   BUN 8 10/22/2022   CREATININE 0.75 10/22/2022   GFRNONAA >60 10/22/2022   CALCIUM 8.9 10/22/2022   PROT 7.2 06/26/2022   ALBUMIN 4.3 06/26/2022   BILITOT 0.4 06/26/2022   ALKPHOS 78 06/26/2022   AST 14 (L) 06/26/2022   ALT 6 06/26/2022   ANIONGAP 5 10/22/2022   Last lipids No results found for: "CHOL", "HDL", "LDLCALC", "LDLDIRECT", "TRIG", "CHOLHDL" Last thyroid functions Lab Results  Component Value Date   TSH 3.70 04/11/2022      The ASCVD Risk score (Arnett DK, et al., 2019) failed to calculate for the following reasons:   Cannot find a previous HDL lab   Cannot find a previous total cholesterol lab    Assessment & Plan:   [name] relates approximately 30-year history of fatigue issues but particularly worse past several weeks.  She does have some iron deficiency but without anemia and is getting infusions through hematology.  Prior diagnosis of "myalgic encephalomyelitis ".  She does have extremely easy fatigability with minimal provocation.  Minimal activity can provoke weakness for days.  No signs of autonomic instability.  She is relating some new right scalp paresthesias and also  increasing left upper extremity weakness.  Otherwise nonfocal exam.  Obtain MRI brain to further assess.  Consider neurology referral if above unrevealing and symptoms persist   No follow-ups on file.    Carolann Littler, MD

## 2022-12-07 ENCOUNTER — Ambulatory Visit (HOSPITAL_BASED_OUTPATIENT_CLINIC_OR_DEPARTMENT_OTHER): Payer: 59

## 2022-12-10 ENCOUNTER — Other Ambulatory Visit: Payer: Self-pay | Admitting: Family Medicine

## 2022-12-13 ENCOUNTER — Ambulatory Visit (HOSPITAL_BASED_OUTPATIENT_CLINIC_OR_DEPARTMENT_OTHER)
Admission: RE | Admit: 2022-12-13 | Discharge: 2022-12-13 | Disposition: A | Discharge status: Home / Self Care | Payer: 59 | Source: Ambulatory Visit | Attending: Family Medicine | Admitting: Family Medicine

## 2022-12-13 DIAGNOSIS — R200 Anesthesia of skin: Secondary | ICD-10-CM | POA: Insufficient documentation

## 2022-12-13 DIAGNOSIS — M6281 Muscle weakness (generalized): Secondary | ICD-10-CM | POA: Diagnosis not present

## 2022-12-13 DIAGNOSIS — R202 Paresthesia of skin: Secondary | ICD-10-CM | POA: Diagnosis not present

## 2022-12-13 DIAGNOSIS — R5383 Other fatigue: Secondary | ICD-10-CM | POA: Diagnosis not present

## 2022-12-13 DIAGNOSIS — R29898 Other symptoms and signs involving the musculoskeletal system: Secondary | ICD-10-CM | POA: Insufficient documentation

## 2022-12-13 MED ORDER — GADOBUTROL 1 MMOL/ML IV SOLN
7.4000 mL | Freq: Once | INTRAVENOUS | Status: AC | PRN
  Administered 2022-12-13: 7.4 mL via INTRAVENOUS
  Filled 2022-12-13: qty 7.5

## 2022-12-14 ENCOUNTER — Telehealth: Payer: Self-pay | Admitting: Family Medicine

## 2022-12-14 ENCOUNTER — Other Ambulatory Visit: Payer: Self-pay | Admitting: Family Medicine

## 2022-12-14 DIAGNOSIS — R202 Paresthesia of skin: Secondary | ICD-10-CM

## 2022-12-14 NOTE — Telephone Encounter (Signed)
Please see result note 

## 2022-12-14 NOTE — Telephone Encounter (Signed)
Pt call and stated she is returning your call and will wait on a call back.

## 2022-12-19 NOTE — Telephone Encounter (Signed)
Pt called, returning CMA's call. CMA was unavailable. Pt asked that CMA call back at their earliest convenience. 

## 2022-12-21 NOTE — Telephone Encounter (Signed)
Please advise on question from imaging results

## 2022-12-25 ENCOUNTER — Telehealth (INDEPENDENT_AMBULATORY_CARE_PROVIDER_SITE_OTHER): Payer: 59 | Admitting: Family Medicine

## 2022-12-25 VITALS — BP 141/83 | HR 90 | Ht 66.0 in | Wt 164.0 lb

## 2022-12-25 DIAGNOSIS — E785 Hyperlipidemia, unspecified: Secondary | ICD-10-CM

## 2022-12-25 DIAGNOSIS — R030 Elevated blood-pressure reading, without diagnosis of hypertension: Secondary | ICD-10-CM

## 2022-12-25 DIAGNOSIS — H539 Unspecified visual disturbance: Secondary | ICD-10-CM | POA: Diagnosis not present

## 2022-12-25 NOTE — Addendum Note (Signed)
Addended by: Gwenyth Ober R on: 12/25/2022 07:42 AM   Modules accepted: Orders

## 2022-12-25 NOTE — Telephone Encounter (Signed)
Referral placed.

## 2022-12-25 NOTE — Progress Notes (Signed)
Patient ID: Mallory Collins, female   DOB: Jun 01, 1976, 46 y.o.   MRN: [id number]  Virtual Visit via Video Note  I connected with Mallory Collins on 12/25/22 at  1:15 PM EST by a video enabled telemedicine application and verified that I am speaking with the correct person using two identifiers.  Location patient: home Location provider:work or home office Persons participating in the virtual visit: patient, provider  I discussed the limitations of evaluation and management by telemedicine and the availability of in person appointments. The patient expressed understanding and agreed to proceed.   HPI: [name] called with episode last night around 11 PM of elevated blood pressure with some bilateral visual disturbance.  She states that she had blurred vision left greater than right.  This was mostly peripheral.  She had sensation of head "pressure ".  No associated weakness.  No facial weakness.  No speech changes.  No extremity symptoms.  No diplopia.  She states her blood pressure was 187/70 and she had some sensation of increased heart rate.  Blood pressure has improved since then.  Blood pressure this morning 141/83 which is still very high for her but much improved.  She denies any known history of ocular migraines.  No history of similar symptoms.  No chest pains.  No sense of irregular heartbeat.  Denies any headache today.  She had recent MRI of the brain which showed no findings to suggest multiple sclerosis.  There were some nonspecific supratentorial white matter changes possibly reflecting minimal chronic small vessel ischemic changes.  Patient not had lipid panel in approximately 6 years.  No history of smoking.  No history of diabetes.  Does have family history of CAD in her father in mid 61s.  Her mom also had history of coronary disease in her 52s   ROS: See pertinent positives and negatives per HPI.  Past Medical History:  Diagnosis Date   Agoraphobia    Asthma    Chronic  fatigue    GAD (generalized anxiety disorder)    Hypothyroidism    MDD (major depressive disorder)    Panic attacks     Past Surgical History:  Procedure Laterality Date   WISDOM TOOTH EXTRACTION      Family History  Problem Relation Age of Onset   Hypertension Mother    Cancer Mother        breast   CAD Mother 60       CABG, stents   Heart disease Paternal Grandmother     SOCIAL HX: Non-smoker   Current Outpatient Medications:    albuterol (VENTOLIN HFA) 108 (90 Base) MCG/ACT inhaler, TAKE 2 PUFFS BY MOUTH EVERY 6 HOURS AS NEEDED FOR WHEEZE OR SHORTNESS OF BREATH, Disp: 8.5 each, Rfl: 1   cholecalciferol (VITAMIN D) 1000 units tablet, Take 1,000 Units by mouth daily., Disp: , Rfl:    DULoxetine (CYMBALTA) 60 MG capsule, Take 60 mg by mouth daily., Disp: , Rfl:    ferrous gluconate (FERGON) 324 MG tablet, Take 1 tablet (324 mg total) by mouth daily with breakfast., Disp: 30 tablet, Rfl: 3   levothyroxine (SYNTHROID) 137 MCG tablet, TAKE 1 TABLET BY MOUTH EVERY DAY, Disp: 90 tablet, Rfl: 0   LORazepam (ATIVAN) 0.5 MG tablet, Take 1 tablet (0.5 mg total) by mouth 2 (two) times daily., Disp: 60 tablet, Rfl: 0   Multiple Vitamin (MULTIVITAMIN) tablet, Take 1 tablet by mouth daily., Disp: , Rfl:    ondansetron (ZOFRAN) 4 MG tablet, TAKE 1 TABLET  BY MOUTH EVERY 8 HOURS AS NEEDED FOR NAUSEA AND VOMITING, Disp: 9 tablet, Rfl: 1   propranolol (INDERAL) 10 MG tablet, TAKE 1 TABLET BY MOUTH EVERY DAY AS NEEDED, Disp: 90 tablet, Rfl: 0  EXAM:  VITALS per patient if applicable:  GENERAL: alert, oriented, appears well and in no acute distress  HEENT: atraumatic, conjunttiva clear, no obvious abnormalities on inspection of external nose and ears  NECK: normal movements of the head and neck  LUNGS: on inspection no signs of respiratory distress, breathing rate appears normal, no obvious gross SOB, gasping or wheezing  CV: no obvious cyanosis  MS: moves all visible extremities  without noticeable abnormality  PSYCH/NEURO: pleasant and cooperative, no obvious depression or anxiety, speech and thought processing grossly intact  ASSESSMENT AND PLAN:  Discussed the following assessment and plan:  #1 transient bilateral blurred vision.  This lasted about 30 minutes.  Did not sound typical for TIA.  Query ocular migraine.  No history of prior.  Did not have any neurologic deficits such as facial weakness, speech change, focal extremity weakness, etc.  No associated cognitive changes.  Check carotid Dopplers but suspect they will be normal.  She has had previous echo 2020 which showed no valvular problems and normal EF  #2 elevated blood pressure without diagnosis of hypertension.  We have recommended close monitoring at home.  Be in touch if this remains up over 814 systolic  #3 history of mild hyperlipidemia.  Positive family history of CAD as above.  Future order placed for fasting lipid panel and she will come in tomorrow for that.  We did also discuss possible coronary calcium score to further risk stratify and will get lipid panel back first     I discussed the assessment and treatment plan with the patient. The patient was provided an opportunity to ask questions and all were answered. The patient agreed with the plan and demonstrated an understanding of the instructions.   The patient was advised to call back or seek an in-person evaluation if the symptoms worsen or if the condition fails to improve as anticipated.     Carolann Littler, MD

## 2022-12-26 ENCOUNTER — Telehealth (HOSPITAL_BASED_OUTPATIENT_CLINIC_OR_DEPARTMENT_OTHER): Payer: Self-pay | Admitting: *Deleted

## 2022-12-26 ENCOUNTER — Encounter: Payer: Self-pay | Admitting: Family

## 2022-12-26 ENCOUNTER — Other Ambulatory Visit (INDEPENDENT_AMBULATORY_CARE_PROVIDER_SITE_OTHER): Payer: 59

## 2022-12-26 ENCOUNTER — Telehealth: Payer: Self-pay | Admitting: Family Medicine

## 2022-12-26 DIAGNOSIS — E785 Hyperlipidemia, unspecified: Secondary | ICD-10-CM | POA: Diagnosis not present

## 2022-12-26 LAB — LIPID PANEL
Cholesterol: 181 mg/dL (ref 0–200)
HDL: 52.9 mg/dL (ref >39.00)
LDL Cholesterol: 104 mg/dL — ABNORMAL HIGH (ref 0–99)
NonHDL: 128.26
Total CHOL/HDL Ratio: 3
Triglycerides: 122 mg/dL (ref 0.0–149.0)
VLDL: 24.4 mg/dL (ref 0.0–40.0)

## 2022-12-26 NOTE — Telephone Encounter (Signed)
Left message for patient to call and discuss scheduling the Cardit duplex ordered by Dr. Elease Hashimoto

## 2022-12-26 NOTE — Telephone Encounter (Signed)
Zigmund Daniel with [facility] [phone]  Requesting PA  PA is needed for the Carotid Duplex.  Please update in Epic.

## 2022-12-26 NOTE — Telephone Encounter (Signed)
Called office and requested insurance prior authorizatin for the Carotid duplex ordered by Dr. Elease Hashimoto

## 2022-12-27 NOTE — Telephone Encounter (Signed)
Left message for patient to call and discuss scheduling the Carotid duplex ordered bu Dr. Elease Hashimoto

## 2023-01-01 NOTE — Telephone Encounter (Signed)
Called to discuss scheduling the Carotid duplex ordered by Dr. Timmothy Euler air dropped

## 2023-01-04 ENCOUNTER — Encounter (HOSPITAL_BASED_OUTPATIENT_CLINIC_OR_DEPARTMENT_OTHER): Payer: Self-pay | Admitting: *Deleted

## 2023-01-07 ENCOUNTER — Other Ambulatory Visit: Payer: Self-pay | Admitting: Adult Health

## 2023-01-07 DIAGNOSIS — F410 Panic disorder [episodic paroxysmal anxiety] without agoraphobia: Secondary | ICD-10-CM

## 2023-01-07 DIAGNOSIS — F4000 Agoraphobia, unspecified: Secondary | ICD-10-CM

## 2023-01-07 DIAGNOSIS — F411 Generalized anxiety disorder: Secondary | ICD-10-CM

## 2023-01-08 NOTE — Telephone Encounter (Signed)
error 

## 2023-01-08 NOTE — Telephone Encounter (Signed)
Filled 10/31 f/u due in feb

## 2023-01-08 NOTE — Telephone Encounter (Signed)
Please schedule appt

## 2023-01-09 ENCOUNTER — Ambulatory Visit (HOSPITAL_COMMUNITY)
Admission: RE | Admit: 2023-01-09 | Discharge: 2023-01-09 | Disposition: A | Discharge status: Home / Self Care | Payer: 59 | Source: Ambulatory Visit | Attending: Family Medicine | Admitting: Family Medicine

## 2023-01-09 DIAGNOSIS — H539 Unspecified visual disturbance: Secondary | ICD-10-CM | POA: Diagnosis not present

## 2023-01-09 DIAGNOSIS — H53121 Transient visual loss, right eye: Secondary | ICD-10-CM | POA: Diagnosis not present

## 2023-01-10 ENCOUNTER — Telehealth: Payer: Self-pay | Admitting: Family Medicine

## 2023-01-10 NOTE — Telephone Encounter (Signed)
Pt is calling and would like cartoid results

## 2023-01-11 NOTE — Telephone Encounter (Signed)
Please see result note 

## 2023-01-11 NOTE — Telephone Encounter (Signed)
Pt is scheduled 01/29/23

## 2023-01-11 NOTE — Telephone Encounter (Signed)
Pt is returning mykal call 

## 2023-01-14 ENCOUNTER — Other Ambulatory Visit: Payer: Self-pay

## 2023-01-14 DIAGNOSIS — H539 Unspecified visual disturbance: Secondary | ICD-10-CM

## 2023-01-14 DIAGNOSIS — I6529 Occlusion and stenosis of unspecified carotid artery: Secondary | ICD-10-CM

## 2023-01-14 NOTE — Progress Notes (Unsigned)
[facility] [place]  ASSESSMENT / PLAN: 47 y.o. female with constellation of neurologic symptoms which does not appear specific for carotid stenosis on my evaluation.  Noninvasive testing suggests possible left carotid artery stenosis.  On personal review of the images, I see highly tortuous carotid systems bilaterally, which may have given a falsely elevated sampling of the left internal carotid artery velocity.  Will plan to check a CT angiogram of the head and neck in the coming days.  I will call the patient with the results. She is scheduled to see Neurology.  CHIEF COMPLAINT: Visual disturbance, hypertension, arm weakness  HISTORY OF PRESENT ILLNESS: Mallory Collins is a 47 y.o. female to clinic for evaluation of possible carotid artery stenosis.  The patient reports she had an episode of near syncope several weeks ago.  This was associated with sensation of blurred vision and loss of visual acuity.  She reports some left sided arm weakness during that time.  Blood pressure measured immediately after the episode showed a highly elevated blood pressure reading in the [id number] systolic.  Workup was initiated by her primary care physician.  This included carotid artery duplex.  The carotid artery duplex suggested possible left carotid artery stenosis.  The technologist reports tortuous carotid vessels.  Past Medical History:  Diagnosis Date   Agoraphobia    Asthma    Chronic fatigue    GAD (generalized anxiety disorder)    Hypothyroidism    MDD (major depressive disorder)    Panic attacks     Past Surgical History:  Procedure Laterality Date   WISDOM TOOTH EXTRACTION      Family History  Problem Relation Age of Onset   Hypertension Mother    Cancer Mother        breast   CAD Mother 56       CABG, stents   Heart disease Paternal Grandmother     Social History   Socioeconomic History   Marital status: Married    Spouse name: Not on file   Number of  children: Not on file   Years of education: Not on file   Highest education level: Some college, no degree  Occupational History   Not on file  Tobacco Use   Smoking status: Never   Smokeless tobacco: Never  Vaping Use   Vaping Use: Never used  Substance and Sexual Activity   Alcohol use: Never   Drug use: Never   Sexual activity: Not on file  Other Topics Concern   Not on file  Social History Narrative   Not on file   Social Determinants of Health   Financial Resource Strain: Unknown (11/25/2022)   Overall Financial Resource Strain (CARDIA)    Difficulty of Paying Living Expenses: Patient refused  Food Insecurity: Unknown (11/25/2022)   Hunger Vital Sign    Worried About Running Out of Food in the Last Year: Patient refused    [place] in the Last Year: Patient refused  Transportation Needs: No Transportation Needs (11/25/2022)   PRAPARE - Hydrologist (Medical): No    Lack of Transportation (Non-Medical): No  Physical Activity: Unknown (11/25/2022)   Exercise Vital Sign    Days of Exercise per Week: 0 days    Minutes of Exercise per Session: Not on file  Stress: No Stress Concern Present (11/25/2022)   [place]    Feeling of Stress : Only a  little  Social Connections: Unknown (11/25/2022)   Social Connection and Isolation Panel [NHANES]    Frequency of Communication with Friends and Family: Patient refused    Frequency of Social Gatherings with Friends and Family: Patient refused    Attends Religious Services: Patient refused    Marine scientist or Organizations: Patient refused    Attends Music therapist: Not on file    Marital Status: Married  Human resources officer Violence: Not on file    No Known Allergies  Current Outpatient Medications  Medication Sig Dispense Refill   albuterol (VENTOLIN HFA) 108 (90 Base) MCG/ACT inhaler TAKE 2 PUFFS BY  MOUTH EVERY 6 HOURS AS NEEDED FOR WHEEZE OR SHORTNESS OF BREATH 8.5 each 1   cholecalciferol (VITAMIN D) 1000 units tablet Take 1,000 Units by mouth daily.     DULoxetine (CYMBALTA) 60 MG capsule Take 60 mg by mouth daily.     ferrous gluconate (FERGON) 324 MG tablet Take 1 tablet (324 mg total) by mouth daily with breakfast. 30 tablet 3   levothyroxine (SYNTHROID) 137 MCG tablet TAKE 1 TABLET BY MOUTH EVERY DAY 90 tablet 0   LORazepam (ATIVAN) 0.5 MG tablet TAKE 1 TABLET BY MOUTH 2 TIMES DAILY. 60 tablet 0   Multiple Vitamin (MULTIVITAMIN) tablet Take 1 tablet by mouth daily.     ondansetron (ZOFRAN) 4 MG tablet TAKE 1 TABLET BY MOUTH EVERY 8 HOURS AS NEEDED FOR NAUSEA AND VOMITING 9 tablet 1   propranolol (INDERAL) 10 MG tablet TAKE 1 TABLET BY MOUTH EVERY DAY AS NEEDED 90 tablet 0   No current facility-administered medications for this visit.    PHYSICAL EXAM Vitals:   01/15/23 1445 01/15/23 1448  BP: (!) 136/92 (!) 132/90  Pulse: 100   Resp: 20   Temp: 98.1 F (36.7 C)   SpO2: 99%   Weight: 164 lb (74.4 kg)   Height: '5\' 6"'$  (1.676 m)     Appearing young woman in no acute distress Regular rate and rhythm Unlabored breathing No focal neurologic signs   PERTINENT LABORATORY AND RADIOLOGIC DATA  Most recent CBC    Latest Ref Rng & Units 10/22/2022    1:37 PM 10/17/2022   10:19 AM 06/26/2022    2:20 PM  CBC  WBC 4.0 - 10.5 K/uL 9.2  8.1  8.2   Hemoglobin 12.0 - 15.0 g/dL 13.8  13.6  12.6   Hematocrit 36.0 - 46.0 % 43.0  41.7  39.1   Platelets 150 - 400 K/uL 417  423  398      Most recent CMP    Latest Ref Rng & Units 10/22/2022    1:37 PM 06/26/2022    2:20 PM 06/15/2022    1:20 PM  CMP  Glucose 70 - 99 mg/dL 111  105  96   BUN 6 - 20 mg/dL '8  8  8   '$ Creatinine 0.44 - 1.00 mg/dL 0.75  0.81  0.79   Sodium 135 - 145 mmol/L 141  141  138   Potassium 3.5 - 5.1 mmol/L 4.3  3.9  4.3   Chloride 98 - 111 mmol/L 107  103  101   CO2 22 - 32 mmol/L '29  30  31   '$ Calcium  8.9 - 10.3 mg/dL 8.9  9.5  9.7   Total Protein 6.5 - 8.1 g/dL  7.2  7.4   Total Bilirubin 0.3 - 1.2 mg/dL  0.4  0.4   Alkaline Phos 38 - 126  U/L  78  80   AST 15 - 41 U/L  14  20   ALT 0 - 44 U/L  6  12     Renal function CrCl cannot be calculated (Patient's most recent lab result is older than the maximum 21 days allowed.).  No results found for: "HGBA1C"  LDL Cholesterol  Date Value Ref Range Status  12/26/2022 104 (H) 0 - 99 mg/dL Final     Vascular Imaging: Right Carotid: There is no evidence of stenosis in the right ICA. There  was no                 evidence of thrombus, dissection, atherosclerotic plaque or                 stenosis in the cervical carotid system.   Left Carotid: Elevated velocities in the mid ICA consistent with 40-59%                stenosis; however, no obvious plaque is noted throughout the                carotid system.   Vertebrals:  Bilateral vertebral arteries demonstrate antegrade flow.  Subclavians: Normal flow hemodynamics were seen in bilateral subclavian               arteries.   Yevonne Aline. Stanford Breed, MD [facility] Phone Number: [phone] 01/15/2023 5:04 PM   Total time spent on preparing this encounter including chart review, data review, collecting history, examining the patient, coordinating care for this new patient, 60 minutes.  Portions of this report may have been transcribed using voice recognition software.  Every effort has been made to ensure accuracy; however, inadvertent computerized transcription errors may still be present.

## 2023-01-15 ENCOUNTER — Ambulatory Visit (INDEPENDENT_AMBULATORY_CARE_PROVIDER_SITE_OTHER): Payer: 59 | Admitting: Vascular Surgery

## 2023-01-15 ENCOUNTER — Encounter: Payer: Self-pay | Admitting: Vascular Surgery

## 2023-01-15 VITALS — BP 132/90 | HR 100 | Temp 98.1°F | Resp 20 | Ht 66.0 in | Wt 164.0 lb

## 2023-01-15 DIAGNOSIS — I6522 Occlusion and stenosis of left carotid artery: Secondary | ICD-10-CM | POA: Diagnosis not present

## 2023-01-18 ENCOUNTER — Inpatient Hospital Stay: Payer: 59 | Admitting: Family

## 2023-01-18 ENCOUNTER — Inpatient Hospital Stay: Payer: 59

## 2023-01-22 ENCOUNTER — Inpatient Hospital Stay: Payer: 59 | Attending: Hematology & Oncology

## 2023-01-22 ENCOUNTER — Telehealth: Payer: Self-pay | Admitting: Family Medicine

## 2023-01-22 ENCOUNTER — Inpatient Hospital Stay (HOSPITAL_BASED_OUTPATIENT_CLINIC_OR_DEPARTMENT_OTHER): Payer: 59 | Admitting: Family

## 2023-01-22 ENCOUNTER — Encounter: Payer: Self-pay | Admitting: Family

## 2023-01-22 VITALS — BP 125/78 | HR 94 | Temp 98.5°F | Resp 17 | Wt 175.8 lb

## 2023-01-22 DIAGNOSIS — D500 Iron deficiency anemia secondary to blood loss (chronic): Secondary | ICD-10-CM | POA: Diagnosis not present

## 2023-01-22 DIAGNOSIS — D75838 Other thrombocytosis: Secondary | ICD-10-CM | POA: Insufficient documentation

## 2023-01-22 DIAGNOSIS — E611 Iron deficiency: Secondary | ICD-10-CM | POA: Insufficient documentation

## 2023-01-22 DIAGNOSIS — H539 Unspecified visual disturbance: Secondary | ICD-10-CM

## 2023-01-22 LAB — CBC WITH DIFFERENTIAL (CANCER CENTER ONLY)
Abs Immature Granulocytes: 0.02 10*3/uL (ref 0.00–0.07)
Basophils Absolute: 0 10*3/uL (ref 0.0–0.1)
Basophils Relative: 0 %
Eosinophils Absolute: 0 10*3/uL (ref 0.0–0.5)
Eosinophils Relative: 0 %
HCT: 42.5 % (ref 36.0–46.0)
Hemoglobin: 13.9 g/dL (ref 12.0–15.0)
Immature Granulocytes: 0 %
Lymphocytes Relative: 23 %
Lymphs Abs: 1.8 10*3/uL (ref 0.7–4.0)
MCH: 30 pg (ref 26.0–34.0)
MCHC: 32.7 g/dL (ref 30.0–36.0)
MCV: 91.6 fL (ref 80.0–100.0)
Monocytes Absolute: 0.9 10*3/uL (ref 0.1–1.0)
Monocytes Relative: 11 %
Neutro Abs: 5.4 10*3/uL (ref 1.7–7.7)
Neutrophils Relative %: 66 %
Platelet Count: 390 10*3/uL (ref 150–400)
RBC: 4.64 MIL/uL (ref 3.87–5.11)
RDW: 13.4 % (ref 11.5–15.5)
WBC Count: 8.2 10*3/uL (ref 4.0–10.5)
nRBC: 0 % (ref 0.0–0.2)

## 2023-01-22 LAB — RETICULOCYTES
Immature Retic Fract: 11.1 % (ref 2.3–15.9)
RBC.: 4.55 MIL/uL (ref 3.87–5.11)
Retic Count, Absolute: 115.6 10*3/uL (ref 19.0–186.0)
Retic Ct Pct: 2.5 % (ref 0.4–3.1)

## 2023-01-22 LAB — FERRITIN: Ferritin: 88 ng/mL (ref 11–307)

## 2023-01-22 NOTE — Progress Notes (Addendum)
Hematology and Oncology Follow Up Visit  Corinne Goucher [id number] [birth year] 47 y.o. 01/22/2023   Principle Diagnosis:  Iron deficiency with reactive mild thrombocytosis    Current Therapy:        Oral Jobe Igo) iron PO daily IV iron as indicated    Interim History:  Ms. Clawson is here today for follow-up. She is doing fairly well. She still has some fatigue at times but this does seem to be improved since her last visit.  Her cycle is regular and her flow seems to have returned to normal. No other blood loss noted.  No abnormal bruising, no petechiae.  She is taking her [place] oral iron supplement once daily and tolerating nicely.  She has fluid retention that comes and goes in her lower extremities.  She still has the numbness and tingling in the scalp, left neck and left arm. She states that her initial brain MRI was suggestive of TIA but her Carotid Duplex was stable and now she feels may be a nerve issue. She is seeing vascular surgery and is waiting for further scan orders.  No falls or syncope.  No fever, n/v, cough, rash, SOB, chest pain, abdominal pain or changes in bowel or bladder habits.  She has occasional palpitations with history of SVT.  Appetite and hydration are good. She is concerned about her weight gain over the last year and plans to discuss at her upcoming appointment with [facility] in [place]. She plans to request hormone studies and TSH.   ECOG Performance Status: 1 - Symptomatic but completely ambulatory  Medications:  Allergies as of 01/22/2023   No Known Allergies      Medication List        Accurate as of January 22, 2023  2:11 PM. If you have any questions, ask your nurse or doctor.          albuterol 108 (90 Base) MCG/ACT inhaler Commonly known as: VENTOLIN HFA TAKE 2 PUFFS BY MOUTH EVERY 6 HOURS AS NEEDED FOR WHEEZE OR SHORTNESS OF BREATH   cholecalciferol 1000 units tablet Commonly known as: VITAMIN  D Take 1,000 Units by mouth daily.   DULoxetine 60 MG capsule Commonly known as: CYMBALTA Take 60 mg by mouth daily.   ferrous gluconate 324 MG tablet Commonly known as: FERGON Take 1 tablet (324 mg total) by mouth daily with breakfast.   levothyroxine 137 MCG tablet Commonly known as: SYNTHROID TAKE 1 TABLET BY MOUTH EVERY DAY   LORazepam 0.5 MG tablet Commonly known as: ATIVAN TAKE 1 TABLET BY MOUTH 2 TIMES DAILY.   multivitamin tablet Take 1 tablet by mouth daily.   ondansetron 4 MG tablet Commonly known as: ZOFRAN TAKE 1 TABLET BY MOUTH EVERY 8 HOURS AS NEEDED FOR NAUSEA AND VOMITING   propranolol 10 MG tablet Commonly known as: INDERAL TAKE 1 TABLET BY MOUTH EVERY DAY AS NEEDED        Allergies: No Known Allergies  Past Medical History, Surgical history, Social history, and Family History were reviewed and updated.  Review of Systems: All other 10 point review of systems is negative.   Physical Exam:  vitals were not taken for this visit.   Wt Readings from Last 3 Encounters:  01/15/23 164 lb (74.4 kg)  12/25/22 164 lb (74.4 kg)  11/28/22 164 lb (74.4 kg)    Ocular: Sclerae unicteric, pupils equal, round and reactive to light Ear-nose-throat: Oropharynx clear, dentition fair Lymphatic: No cervical or supraclavicular adenopathy Lungs no  rales or rhonchi, good excursion bilaterally Heart regular rate and rhythm, no murmur appreciated Abd soft, nontender, positive bowel sounds MSK no focal spinal tenderness, no joint edema Neuro: non-focal, well-oriented, appropriate affect Breasts: Deferred    Lab Results  Component Value Date   WBC 9.2 10/22/2022   HGB 13.8 10/22/2022   HCT 43.0 10/22/2022   MCV 87.4 10/22/2022   PLT 417 (H) 10/22/2022   Lab Results  Component Value Date   FERRITIN 7 (L) 10/17/2022   IRON 45 10/17/2022   TIBC 396 10/17/2022   UIBC 351 10/17/2022   IRONPCTSAT 11 10/17/2022   Lab Results  Component Value Date    RETICCTPCT 2.2 10/17/2022   RBC 4.92 10/22/2022   No results found for: "KPAFRELGTCHN", "LAMBDASER", "KAPLAMBRATIO" No results found for: "IGGSERUM", "IGA", "IGMSERUM" No results found for: "TOTALPROTELP", "ALBUMINELP", "A1GS", "A2GS", "BETS", "BETA2SER", "GAMS", "MSPIKE", "SPEI"   Chemistry      Component Value Date/Time   NA 141 10/22/2022 1337   K 4.3 10/22/2022 1337   CL 107 10/22/2022 1337   CO2 29 10/22/2022 1337   BUN 8 10/22/2022 1337   CREATININE 0.75 10/22/2022 1337   CREATININE 0.81 06/26/2022 1420      Component Value Date/Time   CALCIUM 8.9 10/22/2022 1337   ALKPHOS 78 06/26/2022 1420   AST 14 (L) 06/26/2022 1420   ALT 6 06/26/2022 1420   BILITOT 0.4 06/26/2022 1420       Impression and Plan:  Ms. 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  Iron studies ar pending.  Follow-up in 4 months.   Lottie Dawson, NP 1/23/20242:11 PM

## 2023-01-22 NOTE — Telephone Encounter (Signed)
Checking on progress of her request to change a neuro consult to a  provider. Says she spoke with you about it about a week ago

## 2023-01-22 NOTE — Telephone Encounter (Signed)
I spoke with the patient and she stated she would like to see a provider with [facility]. This referral to Neurology was previously placed on 01/14/2023. Ok to place new referral to [employer]? Patient also reported that she has been gaining weight and inquired if Mancel Parsons can be called into her pharmacy.

## 2023-01-23 LAB — IRON AND IRON BINDING CAPACITY (CC-WL,HP ONLY)
Iron: 138 ug/dL (ref 28–170)
Saturation Ratios: 46 % — ABNORMAL HIGH (ref 10.4–31.8)
TIBC: 298 ug/dL (ref 250–450)
UIBC: 160 ug/dL (ref 148–442)

## 2023-01-23 NOTE — Addendum Note (Signed)
Addended by: Nilda Riggs on: 01/23/2023 08:08 AM   Modules accepted: Orders

## 2023-01-23 NOTE — Telephone Encounter (Signed)
Referral placed and left message for the patient to return my call.

## 2023-01-24 NOTE — Telephone Encounter (Signed)
Patient informed that referral was placed and informed of the message below. Patient stated she would return call to schedule follow up.

## 2023-01-29 ENCOUNTER — Telehealth (INDEPENDENT_AMBULATORY_CARE_PROVIDER_SITE_OTHER): Payer: 59 | Admitting: Adult Health

## 2023-01-29 ENCOUNTER — Encounter: Payer: Self-pay | Admitting: Adult Health

## 2023-01-29 ENCOUNTER — Other Ambulatory Visit: Payer: Self-pay

## 2023-01-29 ENCOUNTER — Encounter: Payer: Self-pay | Admitting: Family

## 2023-01-29 DIAGNOSIS — F411 Generalized anxiety disorder: Secondary | ICD-10-CM

## 2023-01-29 DIAGNOSIS — F331 Major depressive disorder, recurrent, moderate: Secondary | ICD-10-CM

## 2023-01-29 DIAGNOSIS — F4001 Agoraphobia with panic disorder: Secondary | ICD-10-CM

## 2023-01-29 DIAGNOSIS — I6522 Occlusion and stenosis of left carotid artery: Secondary | ICD-10-CM

## 2023-01-29 DIAGNOSIS — F4000 Agoraphobia, unspecified: Secondary | ICD-10-CM

## 2023-01-29 DIAGNOSIS — F410 Panic disorder [episodic paroxysmal anxiety] without agoraphobia: Secondary | ICD-10-CM

## 2023-01-29 MED ORDER — LORAZEPAM 0.5 MG PO TABS: 0.5000 mg | ORAL_TABLET | Freq: Two times a day (BID) | ORAL | 2 refills | Status: DC

## 2023-01-29 NOTE — Progress Notes (Signed)
Mallory Collins [id number] 1976-08-13 47 y.o.  Virtual Visit via Video Note  I connected with pt @ on 01/29/23 at  3:20 PM EST by a video enabled telemedicine application and verified that I am speaking with the correct person using two identifiers.   I discussed the limitations of evaluation and management by telemedicine and the availability of in person appointments. The patient expressed understanding and agreed to proceed.  I discussed the assessment and treatment plan with the patient. The patient was provided an opportunity to ask questions and all were answered. The patient agreed with the plan and demonstrated an understanding of the instructions.   The patient was advised to call back or seek an in-person evaluation if the symptoms worsen or if the condition fails to improve as anticipated.  I provided 25 minutes of non-face-to-face time during this encounter.  The patient was located at home.  The provider was located at [place].   Aloha Gell, NP   Subjective:   Patient ID:  Mallory Collins is a 47 y.o. (DOB 1976-06-21) female.  Chief Complaint: No chief complaint on file.   HPI Mallory Collins presents for follow-up of GAD, MDD, panic attacks, and agoraphobia.   Describes mood today as "ok". Denies tearfulness. Mood symptoms - denies depression and irritability - things are even keeled". Reports some situational anxiety - "comes and goes". Denies worry and rumination. Denies recent panic attacks. Mood is consistent. Stating "I'm doing pretty good". Feels like the Ativan works well as needed. Sister is doing better - currently disabled. Reports iron transfusions - feeling better. Stable interest and motivation. Taking medications as prescribed.  Energy levels improved. Active, does not have a regular exercise routine.  Enjoys some usual interests and activities. Married. Lives with husband and mother - 2 dogs. Mother broke her shoulder at  Christmas - 71. Spending time with family. Appetite adequate. Weight gain - 165 to 175 pounds.  Sleeps well most nights. Averages 9 - 10 hours. Focus and concentration stable. Completing tasks. Managing aspects of household. Unable to work - Fibromyalgia. Denies SI or HI.  Denies AH or VH.  Previous medication trials: Clonazepam - too sedating, Zoloft   Review of Systems:  Review of Systems  Musculoskeletal:  Negative for gait problem.  Neurological:  Negative for tremors.  Psychiatric/Behavioral:         Please refer to HPI    Medications: I have reviewed the patient's current medications.  Current Outpatient Medications  Medication Sig Dispense Refill   albuterol (VENTOLIN HFA) 108 (90 Base) MCG/ACT inhaler TAKE 2 PUFFS BY MOUTH EVERY 6 HOURS AS NEEDED FOR WHEEZE OR SHORTNESS OF BREATH 8.5 each 1   cholecalciferol (VITAMIN D) 1000 units tablet Take 1,000 Units by mouth daily.     DULoxetine (CYMBALTA) 60 MG capsule Take 60 mg by mouth daily.     ferrous gluconate (FERGON) 324 MG tablet Take 1 tablet (324 mg total) by mouth daily with breakfast. 30 tablet 3   levothyroxine (SYNTHROID) 137 MCG tablet TAKE 1 TABLET BY MOUTH EVERY DAY 90 tablet 0   LORazepam (ATIVAN) 0.5 MG tablet Take 1 tablet (0.5 mg total) by mouth 2 (two) times daily. 60 tablet 2   Multiple Vitamin (MULTIVITAMIN) tablet Take 1 tablet by mouth daily.     ondansetron (ZOFRAN) 4 MG tablet TAKE 1 TABLET BY MOUTH EVERY 8 HOURS AS NEEDED FOR NAUSEA AND VOMITING 9 tablet 1   propranolol (INDERAL) 10 MG tablet TAKE  1 TABLET BY MOUTH EVERY DAY AS NEEDED 90 tablet 0   No current facility-administered medications for this visit.    Medication Side Effects: None  Allergies: No Known Allergies  Past Medical History:  Diagnosis Date   Agoraphobia    Asthma    Chronic fatigue    GAD (generalized anxiety disorder)    Hypothyroidism    MDD (major depressive disorder)    Panic attacks     Family History  Problem  Relation Age of Onset   Hypertension Mother    Cancer Mother        breast   CAD Mother 99       CABG, stents   Heart disease Paternal Grandmother     Social History   Socioeconomic History   Marital status: Married    Spouse name: Not on file   Number of children: Not on file   Years of education: Not on file   Highest education level: Some college, no degree  Occupational History   Not on file  Tobacco Use   Smoking status: Never   Smokeless tobacco: Never  Vaping Use   Vaping Use: Never used  Substance and Sexual Activity   Alcohol use: Never   Drug use: Never   Sexual activity: Not on file  Other Topics Concern   Not on file  Social History Narrative   Not on file   Social Determinants of Health   Financial Resource Strain: Unknown (11/25/2022)   Overall Financial Resource Strain (CARDIA)    Difficulty of Paying Living Expenses: Patient refused  Food Insecurity: Unknown (11/25/2022)   Hunger Vital Sign    Worried About Running Out of Food in the Last Year: Patient refused    [place] in the Last Year: Patient refused  Transportation Needs: No Transportation Needs (11/25/2022)   PRAPARE - Hydrologist (Medical): No    Lack of Transportation (Non-Medical): No  Physical Activity: Unknown (11/25/2022)   Exercise Vital Sign    Days of Exercise per Week: 0 days    Minutes of Exercise per Session: Not on file  Stress: No Stress Concern Present (11/25/2022)   [place]    Feeling of Stress : Only a little  Social Connections: Unknown (11/25/2022)   Social Connection and Isolation Panel [NHANES]    Frequency of Communication with Friends and Family: Patient refused    Frequency of Social Gatherings with Friends and Family: Patient refused    Attends Religious Services: Patient refused    Marine scientist or Organizations: Patient refused    Attends Programme researcher, broadcasting/film/video: Not on file    Marital Status: Married  Human resources officer Violence: Not on file    Past Medical History, Surgical history, Social history, and Family history were reviewed and updated as appropriate.   Please see review of systems for further details on the patient's review from today.   Objective:   Physical Exam:  There were no vitals taken for this visit.  Physical Exam Constitutional:      General: She is not in acute distress. Musculoskeletal:        General: No deformity.  Neurological:     Mental Status: She is alert and oriented to person, place, and time.     Coordination: Coordination normal.  Psychiatric:        Attention and Perception: Attention and perception normal. She does not  perceive auditory or visual hallucinations.        Mood and Affect: Mood normal. Mood is not anxious or depressed. Affect is not labile, blunt, angry or inappropriate.        Speech: Speech normal.        Behavior: Behavior normal.        Thought Content: Thought content normal. Thought content is not paranoid or delusional. Thought content does not include homicidal or suicidal ideation. Thought content does not include homicidal or suicidal plan.        Cognition and Memory: Cognition and memory normal.        Judgment: Judgment normal.     Comments: Insight intact     Lab Review:     Component Value Date/Time   NA 141 10/22/2022 1337   K 4.3 10/22/2022 1337   CL 107 10/22/2022 1337   CO2 29 10/22/2022 1337   GLUCOSE 111 (H) 10/22/2022 1337   BUN 8 10/22/2022 1337   CREATININE 0.75 10/22/2022 1337   CREATININE 0.81 06/26/2022 1420   CALCIUM 8.9 10/22/2022 1337   PROT 7.2 06/26/2022 1420   ALBUMIN 4.3 06/26/2022 1420   AST 14 (L) 06/26/2022 1420   ALT 6 06/26/2022 1420   ALKPHOS 78 06/26/2022 1420   BILITOT 0.4 06/26/2022 1420   GFRNONAA >60 10/22/2022 1337   GFRNONAA >60 06/26/2022 1420   GFRAA  05/22/2010 0100    >60        The eGFR has been  calculated using the MDRD equation. This calculation has not been validated in all clinical situations. eGFR's persistently <60 mL/min signify possible Chronic Kidney Disease.       Component Value Date/Time   WBC 8.2 01/22/2023 1404   WBC 9.2 10/22/2022 1337   RBC 4.64 01/22/2023 1404   RBC 4.55 01/22/2023 1404   HGB 13.9 01/22/2023 1404   HCT 42.5 01/22/2023 1404   PLT 390 01/22/2023 1404   MCV 91.6 01/22/2023 1404   MCH 30.0 01/22/2023 1404   MCHC 32.7 01/22/2023 1404   RDW 13.4 01/22/2023 1404   LYMPHSABS 1.8 01/22/2023 1404   MONOABS 0.9 01/22/2023 1404   EOSABS 0.0 01/22/2023 1404   BASOSABS 0.0 01/22/2023 1404    No results found for: "POCLITH", "LITHIUM"   No results found for: "PHENYTOIN", "PHENOBARB", "VALPROATE", "CBMZ"   .res Assessment: Plan:    Plan:  PDMP reviewed  1. Ativan 0.'5mg'$  daily as needed for anxiety/agoraphobia  Taking Cymbalta - '60mg'$  daily for fibromyalgia  RTC 3 months   Patient advised to contact office with any questions, adverse effects, or acute worsening in signs and symptoms.  Discussed potential benefits, risk, and side effects of benzodiazepines to include potential risk of tolerance and dependence, as well as possible drowsiness.  Advised patient not to drive if experiencing drowsiness and to take lowest possible effective dose to minimize risk of dependence and tolerance. Diagnoses and all orders for this visit:  Major depressive disorder, recurrent episode, moderate (HCC)  Generalized anxiety disorder -     LORazepam (ATIVAN) 0.5 MG tablet; Take 1 tablet (0.5 mg total) by mouth 2 (two) times daily.  Panic attacks -     LORazepam (ATIVAN) 0.5 MG tablet; Take 1 tablet (0.5 mg total) by mouth 2 (two) times daily.  Agoraphobia -     LORazepam (ATIVAN) 0.5 MG tablet; Take 1 tablet (0.5 mg total) by mouth 2 (two) times daily.     Please see After Visit Summary for patient specific  instructions.  Future Appointments   Date Time Provider [place]  02/02/2023  4:00 PM [facility]  02/02/2023  5:00 PM [facility]  02/05/2023  4:00 PM Cherre Robins, MD [facility]  02/25/2023  2:30 PM Marcial Pacas, MD [facility] None  05/24/2023  1:30 PM [facility] None  05/24/2023  1:45 PM Celso Amy, NP [facility] None    No orders of the defined types were placed in this encounter.     -------------------------------

## 2023-02-02 ENCOUNTER — Ambulatory Visit (HOSPITAL_BASED_OUTPATIENT_CLINIC_OR_DEPARTMENT_OTHER): Admission: RE | Admit: 2023-02-02 | Payer: 59 | Source: Ambulatory Visit

## 2023-02-02 ENCOUNTER — Ambulatory Visit (HOSPITAL_BASED_OUTPATIENT_CLINIC_OR_DEPARTMENT_OTHER): Payer: 59

## 2023-02-05 ENCOUNTER — Ambulatory Visit: Payer: 59 | Admitting: Vascular Surgery

## 2023-02-09 ENCOUNTER — Encounter (HOSPITAL_BASED_OUTPATIENT_CLINIC_OR_DEPARTMENT_OTHER): Payer: Self-pay

## 2023-02-09 ENCOUNTER — Ambulatory Visit (HOSPITAL_BASED_OUTPATIENT_CLINIC_OR_DEPARTMENT_OTHER)
Admission: RE | Admit: 2023-02-09 | Discharge: 2023-02-09 | Disposition: A | Discharge status: Home / Self Care | Payer: 59 | Source: Ambulatory Visit | Attending: Vascular Surgery | Admitting: Vascular Surgery

## 2023-02-09 ENCOUNTER — Ambulatory Visit (HOSPITAL_BASED_OUTPATIENT_CLINIC_OR_DEPARTMENT_OTHER): Admission: RE | Admit: 2023-02-09 | Payer: 59 | Source: Ambulatory Visit

## 2023-02-09 DIAGNOSIS — I6522 Occlusion and stenosis of left carotid artery: Secondary | ICD-10-CM | POA: Diagnosis present

## 2023-02-09 MED ORDER — IOHEXOL 350 MG/ML SOLN
100.0000 mL | Freq: Once | INTRAVENOUS | Status: AC | PRN
  Administered 2023-02-09: 75 mL via INTRAVENOUS

## 2023-02-11 NOTE — Progress Notes (Unsigned)
[facility] [place]  ASSESSMENT / PLAN: 47 y.o. female with constellation of neurologic symptoms which does not appear specific for carotid stenosis on my evaluation.  Noninvasive testing suggests possible left carotid artery stenosis.  On personal review of the images, I see highly tortuous carotid systems bilaterally, which may have given a falsely elevated sampling of the left internal carotid artery velocity.  Will plan to check a CT angiogram of the head and neck in the coming days.  I will call the patient with the results. She is scheduled to see Neurology.  CHIEF COMPLAINT: Visual disturbance, hypertension, arm weakness  HISTORY OF PRESENT ILLNESS: Mallory Collins is a 47 y.o. female to clinic for evaluation of possible carotid artery stenosis.  The patient reports she had an episode of near syncope several weeks ago.  This was associated with sensation of blurred vision and loss of visual acuity.  She reports some left sided arm weakness during that time.  Blood pressure measured immediately after the episode showed a highly elevated blood pressure reading in the [id number] systolic.  Workup was initiated by her primary care physician.  This included carotid artery duplex.  The carotid artery duplex suggested possible left carotid artery stenosis.  The technologist reports tortuous carotid vessels.  Past Medical History:  Diagnosis Date   Agoraphobia    Asthma    Chronic fatigue    GAD (generalized anxiety disorder)    Hypothyroidism    MDD (major depressive disorder)    Panic attacks     Past Surgical History:  Procedure Laterality Date   WISDOM TOOTH EXTRACTION      Family History  Problem Relation Age of Onset   Hypertension Mother    Cancer Mother        breast   CAD Mother 56       CABG, stents   Heart disease Paternal Grandmother     Social History   Socioeconomic History   Marital status: Married    Spouse name: Not on file   Number of  children: Not on file   Years of education: Not on file   Highest education level: Some college, no degree  Occupational History   Not on file  Tobacco Use   Smoking status: Never   Smokeless tobacco: Never  Vaping Use   Vaping Use: Never used  Substance and Sexual Activity   Alcohol use: Never   Drug use: Never   Sexual activity: Not on file  Other Topics Concern   Not on file  Social History Narrative   Not on file   Social Determinants of Health   Financial Resource Strain: Unknown (11/25/2022)   Overall Financial Resource Strain (CARDIA)    Difficulty of Paying Living Expenses: Patient refused  Food Insecurity: Unknown (11/25/2022)   Hunger Vital Sign    Worried About Running Out of Food in the Last Year: Patient refused    [place] in the Last Year: Patient refused  Transportation Needs: No Transportation Needs (11/25/2022)   PRAPARE - Hydrologist (Medical): No    Lack of Transportation (Non-Medical): No  Physical Activity: Unknown (11/25/2022)   Exercise Vital Sign    Days of Exercise per Week: 0 days    Minutes of Exercise per Session: Not on file  Stress: No Stress Concern Present (11/25/2022)   [place]    Feeling of Stress : Only a  little  Social Connections: Unknown (11/25/2022)   Social Connection and Isolation Panel [NHANES]    Frequency of Communication with Friends and Family: Patient refused    Frequency of Social Gatherings with Friends and Family: Patient refused    Attends Religious Services: Patient refused    Marine scientist or Organizations: Patient refused    Attends Music therapist: Not on file    Marital Status: Married  Human resources officer Violence: Not on file    No Known Allergies  Current Outpatient Medications  Medication Sig Dispense Refill   albuterol (VENTOLIN HFA) 108 (90 Base) MCG/ACT inhaler TAKE 2 PUFFS BY  MOUTH EVERY 6 HOURS AS NEEDED FOR WHEEZE OR SHORTNESS OF BREATH 8.5 each 1   cholecalciferol (VITAMIN D) 1000 units tablet Take 1,000 Units by mouth daily.     DULoxetine (CYMBALTA) 60 MG capsule Take 60 mg by mouth daily.     ferrous gluconate (FERGON) 324 MG tablet Take 1 tablet (324 mg total) by mouth daily with breakfast. 30 tablet 3   levothyroxine (SYNTHROID) 137 MCG tablet TAKE 1 TABLET BY MOUTH EVERY DAY 90 tablet 0   LORazepam (ATIVAN) 0.5 MG tablet Take 1 tablet (0.5 mg total) by mouth 2 (two) times daily. 60 tablet 2   Multiple Vitamin (MULTIVITAMIN) tablet Take 1 tablet by mouth daily.     ondansetron (ZOFRAN) 4 MG tablet TAKE 1 TABLET BY MOUTH EVERY 8 HOURS AS NEEDED FOR NAUSEA AND VOMITING 9 tablet 1   propranolol (INDERAL) 10 MG tablet TAKE 1 TABLET BY MOUTH EVERY DAY AS NEEDED 90 tablet 0   No current facility-administered medications for this visit.    PHYSICAL EXAM There were no vitals filed for this visit.   Appearing young woman in no acute distress Regular rate and rhythm Unlabored breathing No focal neurologic signs   PERTINENT LABORATORY AND RADIOLOGIC DATA  Most recent CBC    Latest Ref Rng & Units 01/22/2023    2:04 PM 10/22/2022    1:37 PM 10/17/2022   10:19 AM  CBC  WBC 4.0 - 10.5 K/uL 8.2  9.2  8.1   Hemoglobin 12.0 - 15.0 g/dL 13.9  13.8  13.6   Hematocrit 36.0 - 46.0 % 42.5  43.0  41.7   Platelets 150 - 400 K/uL 390  417  423      Most recent CMP    Latest Ref Rng & Units 10/22/2022    1:37 PM 06/26/2022    2:20 PM 06/15/2022    1:20 PM  CMP  Glucose 70 - 99 mg/dL 111  105  96   BUN 6 - 20 mg/dL 8  8  8   $ Creatinine 0.44 - 1.00 mg/dL 0.75  0.81  0.79   Sodium 135 - 145 mmol/L 141  141  138   Potassium 3.5 - 5.1 mmol/L 4.3  3.9  4.3   Chloride 98 - 111 mmol/L 107  103  101   CO2 22 - 32 mmol/L 29  30  31   $ Calcium 8.9 - 10.3 mg/dL 8.9  9.5  9.7   Total Protein 6.5 - 8.1 g/dL  7.2  7.4   Total Bilirubin 0.3 - 1.2 mg/dL  0.4  0.4    Alkaline Phos 38 - 126 U/L  78  80   AST 15 - 41 U/L  14  20   ALT 0 - 44 U/L  6  12     Renal function CrCl cannot  be calculated (Patient's most recent lab result is older than the maximum 21 days allowed.).  No results found for: "HGBA1C"  LDL Cholesterol  Date Value Ref Range Status  12/26/2022 104 (H) 0 - 99 mg/dL Final     Vascular Imaging: Right Carotid: There is no evidence of stenosis in the right ICA. There  was no                 evidence of thrombus, dissection, atherosclerotic plaque or                 stenosis in the cervical carotid system.   Left Carotid: Elevated velocities in the mid ICA consistent with 40-59%                stenosis; however, no obvious plaque is noted throughout the                carotid system.   Vertebrals:  Bilateral vertebral arteries demonstrate antegrade flow.  Subclavians: Normal flow hemodynamics were seen in bilateral subclavian               arteries.   Yevonne Aline. Stanford Breed, MD [facility] Phone Number: [phone] 02/11/2023 5:51 PM   Total time spent on preparing this encounter including chart review, data review, collecting history, examining the patient, coordinating care for this new patient, 60 minutes.  Portions of this report may have been transcribed using voice recognition software.  Every effort has been made to ensure accuracy; however, inadvertent computerized transcription errors may still be present.

## 2023-02-12 ENCOUNTER — Ambulatory Visit (INDEPENDENT_AMBULATORY_CARE_PROVIDER_SITE_OTHER): Payer: 59 | Admitting: Vascular Surgery

## 2023-02-12 DIAGNOSIS — R9389 Abnormal findings on diagnostic imaging of other specified body structures: Secondary | ICD-10-CM

## 2023-02-15 ENCOUNTER — Other Ambulatory Visit: Payer: Self-pay | Admitting: Family

## 2023-02-15 DIAGNOSIS — D509 Iron deficiency anemia, unspecified: Secondary | ICD-10-CM

## 2023-02-25 ENCOUNTER — Ambulatory Visit: Payer: 59 | Admitting: Neurology

## 2023-02-25 ENCOUNTER — Telehealth: Payer: Self-pay | Admitting: Neurology

## 2023-02-25 NOTE — Telephone Encounter (Signed)
Pt husband is calling stated she is sick and needs to reschedule appointment.

## 2023-03-06 NOTE — Telephone Encounter (Signed)
Called pt and left a VM to please call the office.

## 2023-03-09 ENCOUNTER — Other Ambulatory Visit: Payer: Self-pay | Admitting: Family Medicine

## 2023-03-26 ENCOUNTER — Encounter: Payer: Self-pay | Admitting: Family

## 2023-04-20 ENCOUNTER — Telehealth: Payer: 59 | Admitting: Nurse Practitioner

## 2023-04-20 DIAGNOSIS — M5442 Lumbago with sciatica, left side: Secondary | ICD-10-CM | POA: Diagnosis not present

## 2023-04-20 MED ORDER — CYCLOBENZAPRINE HCL 10 MG PO TABS
5.0000 mg | ORAL_TABLET | Freq: Three times a day (TID) | ORAL | 0 refills | Status: DC | PRN
Start: 2023-04-20 — End: 2023-10-09

## 2023-04-20 MED ORDER — NAPROXEN 500 MG PO TABS: 500.0000 mg | ORAL_TABLET | Freq: Two times a day (BID) | ORAL | 0 refills | Status: DC

## 2023-04-20 NOTE — Progress Notes (Signed)
I have spent 5 minutes in review of e-visit questionnaire, review and updating patient chart, medical decision making and response to patient.  ° °Jaicob Dia W Sincere Liuzzi, NP ° °  °

## 2023-04-20 NOTE — Progress Notes (Signed)

## 2023-04-30 ENCOUNTER — Ambulatory Visit (INDEPENDENT_AMBULATORY_CARE_PROVIDER_SITE_OTHER): Payer: 59 | Admitting: Family Medicine

## 2023-04-30 ENCOUNTER — Encounter: Payer: Self-pay | Admitting: Family Medicine

## 2023-04-30 VITALS — BP 120/80 | HR 105 | Temp 98.0°F

## 2023-04-30 DIAGNOSIS — R222 Localized swelling, mass and lump, trunk: Secondary | ICD-10-CM | POA: Diagnosis not present

## 2023-04-30 DIAGNOSIS — M5432 Sciatica, left side: Secondary | ICD-10-CM

## 2023-04-30 NOTE — Progress Notes (Signed)
   Subjective:    Patient ID: Mallory Collins, female    DOB: 09/19/1976, 47 y.o.   MRN: [id number]  HPI Here to discuss her left sided low back and leg pain, as well as a tender lump that appeared 4 days ago. She has a prior hx of left sided sciatica, but this had been absent until 4 days ago. She now has a sharp pain in the left lower back and left buttock that radiates down the left leg. No numbness or tingling. Then 3 days ago she had the sudden appearance of a firm lump under the skin on the left upper buttock that was very tender and painful. No recent trauma. This lasted 3 days, then it suddenly began going back down last night, and today she cannot feel it at all. The sciatica has also improved. No fever or other symptoms. She is very concerned that she may have a blood clot in her leg. No chest pain or SOB.    Review of Systems  Constitutional: Negative.   Respiratory: Negative.    Cardiovascular: Negative.   Musculoskeletal:  Positive for arthralgias.  Neurological:  Negative for weakness and numbness.       Objective:   Physical Exam Constitutional:      General: She is not in acute distress.    Appearance: Normal appearance.  Cardiovascular:     Rate and Rhythm: Normal rate and regular rhythm.     Pulses: Normal pulses.     Heart sounds: Normal heart sounds.  Pulmonary:     Effort: Pulmonary effort is normal.     Breath sounds: Normal breath sounds.  Musculoskeletal:     Comments: She is not tender at all in the upper left buttock where the lump was. No lumps or swelling or redness or warmth. The left lower leg is not tender or swollen. No cords are felt.   Neurological:     Mental Status: She is alert.           Assessment & Plan:  She has an underlying issue of sciatica, and this has recently been exacerbated by the mass on her buttock. The mass has resolved, so it is difficult to explain what caused this. Two likely etiologies would be a boil or a spider  bite. I reassured her that h=the=was Now that the mass is gone, she will follow up as needed.  Gershon Crane, MD

## 2023-05-24 ENCOUNTER — Inpatient Hospital Stay: Payer: 59 | Attending: Hematology & Oncology

## 2023-05-24 ENCOUNTER — Inpatient Hospital Stay: Payer: 59 | Admitting: Family

## 2023-06-01 IMAGING — DX DG CHEST 2V
2 series · 2 of 2 positions shown · non-contrast
Comparison: 06/07/2010

CLINICAL DATA: 45-year-old female with cough for several weeks

EXAM:
CHEST - 2 VIEW

[chest pa]
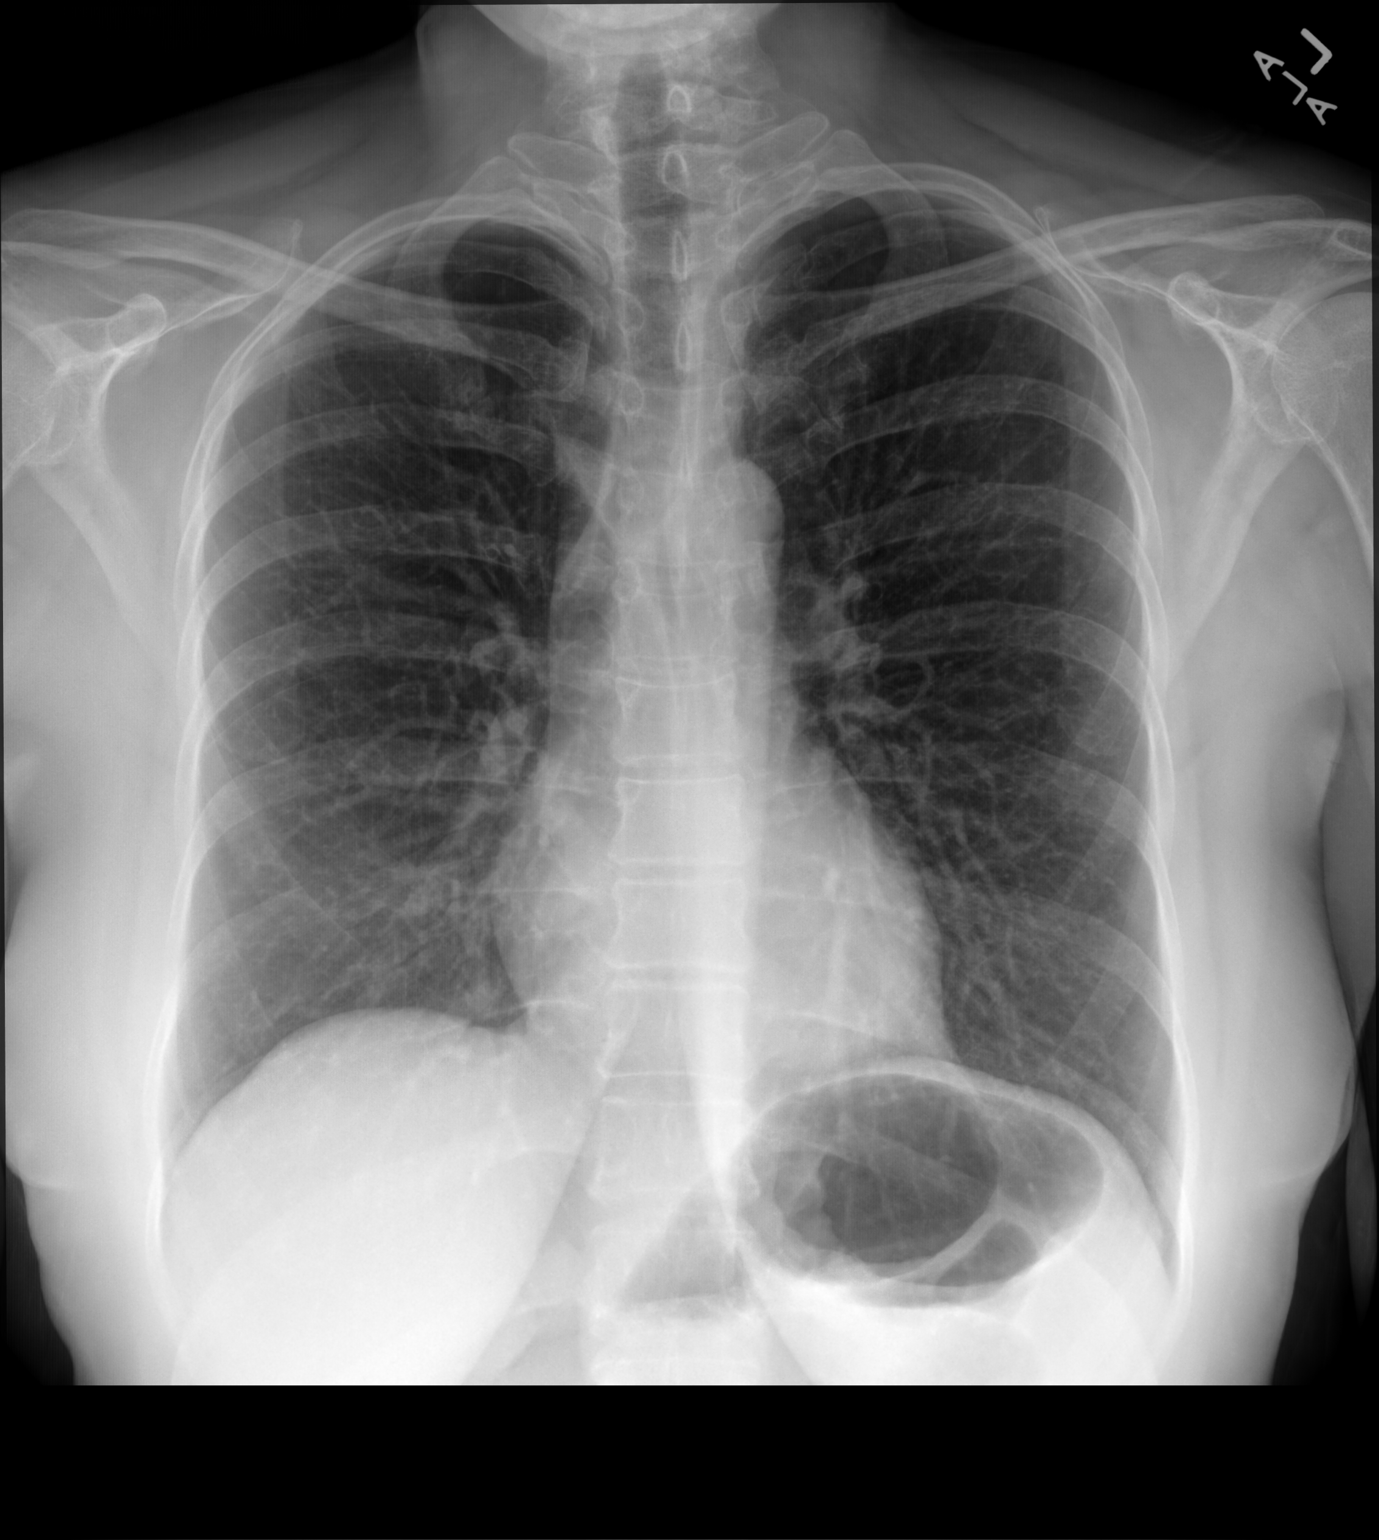

[chest lat]
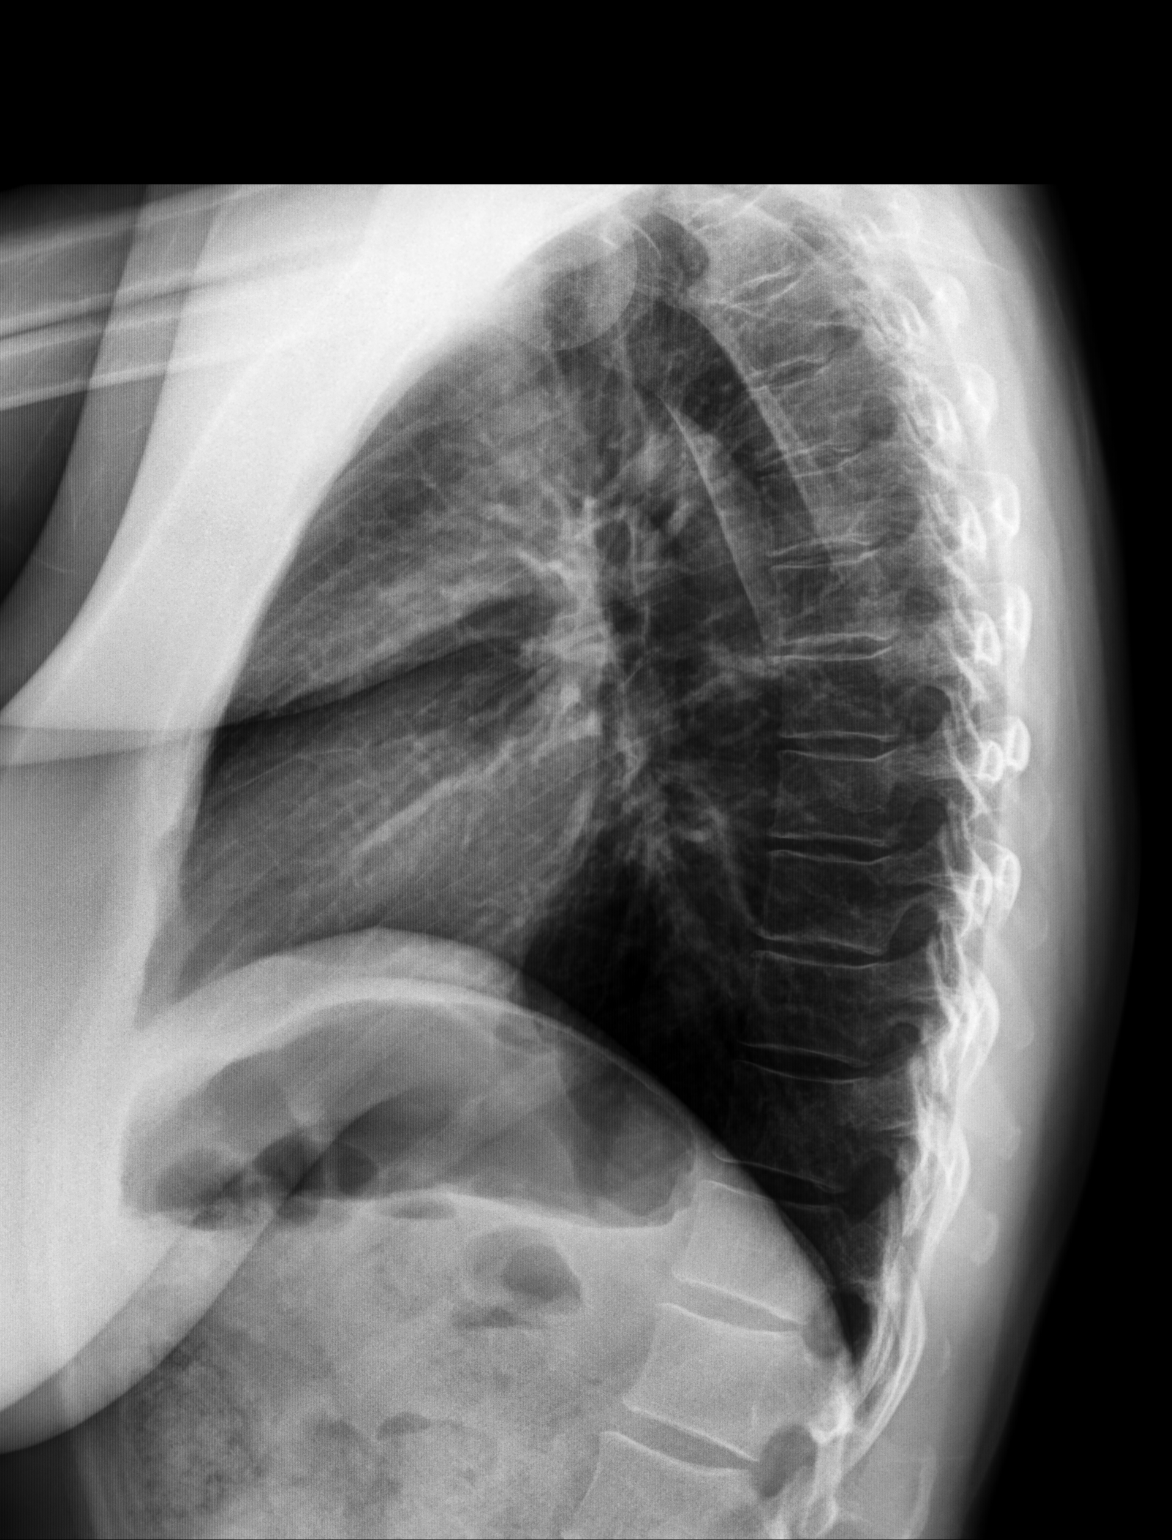

[2 of 2 positions shown; findings below may reference images not displayed]

FINDINGS: Cardiomediastinal silhouette unchanged in size and contour. No
evidence of central vascular congestion. No interlobular septal
thickening. No pneumothorax or pleural effusion. No confluent
airspace disease.

No acute displaced fracture
IMPRESSION: No active cardiopulmonary disease.

## 2023-06-07 ENCOUNTER — Other Ambulatory Visit: Payer: Self-pay | Admitting: Family Medicine

## 2023-06-12 ENCOUNTER — Telehealth: Payer: Self-pay | Admitting: Family Medicine

## 2023-06-12 NOTE — Telephone Encounter (Signed)
Fertility doctor told patient her thyroid levels were extremely high (36) asking for her meds to be adjusted. Pls advise

## 2023-06-13 ENCOUNTER — Encounter: Payer: Self-pay | Admitting: Family Medicine

## 2023-06-13 DIAGNOSIS — E039 Hypothyroidism, unspecified: Secondary | ICD-10-CM

## 2023-06-13 NOTE — Telephone Encounter (Signed)
Spoke to pt.   Pt reports she doesn't have result on her nor it has not posted yet. She said she had it done with [facility] this past Monday or Tuesday. I check with [facility] on epic. It has not posted.   Pt states she would like to start on med asap. Advise her provider need to see lab result first.   offer a visit. Pt declined visit.   Pt states she will send in result via mychart when receive it.   Forwarding to provider for FYI.

## 2023-06-14 MED ORDER — LEVOTHYROXINE SODIUM 150 MCG PO TABS: 150.0000 ug | ORAL_TABLET | Freq: Every day | ORAL | 3 refills | Status: DC

## 2023-06-14 NOTE — Telephone Encounter (Signed)
Spoke to pt. Pt would like to get her TSH early and ask how long will it takes for med to get to her system. She said she will ask her pharmacy and give [place] a call to schedule lab appt.   Please advise.

## 2023-06-17 NOTE — Telephone Encounter (Signed)
Attempted to reach pt. Left a voicemail to contact us back.  

## 2023-06-19 NOTE — Telephone Encounter (Signed)
Spoke to Mallory Collins and inform her of message from Dr. Caryl Never. Verbalized understanding.   Offer to schedule an appt. Mallory Collins states she will call us to schedule her lab appt.

## 2023-07-08 ENCOUNTER — Ambulatory Visit: Payer: 59 | Admitting: Family Medicine

## 2023-08-13 ENCOUNTER — Other Ambulatory Visit (INDEPENDENT_AMBULATORY_CARE_PROVIDER_SITE_OTHER): Payer: 59

## 2023-08-13 ENCOUNTER — Encounter (HOSPITAL_BASED_OUTPATIENT_CLINIC_OR_DEPARTMENT_OTHER): Payer: Self-pay | Admitting: Emergency Medicine

## 2023-08-13 ENCOUNTER — Emergency Department (HOSPITAL_BASED_OUTPATIENT_CLINIC_OR_DEPARTMENT_OTHER)
Admission: EM | Admit: 2023-08-13 | Discharge: 2023-08-14 | Disposition: A | Discharge status: Home / Self Care | Payer: 59 | Attending: Emergency Medicine | Admitting: Emergency Medicine

## 2023-08-13 ENCOUNTER — Other Ambulatory Visit: Payer: Self-pay

## 2023-08-13 DIAGNOSIS — H538 Other visual disturbances: Secondary | ICD-10-CM | POA: Insufficient documentation

## 2023-08-13 DIAGNOSIS — R000 Tachycardia, unspecified: Secondary | ICD-10-CM | POA: Diagnosis not present

## 2023-08-13 DIAGNOSIS — Z79899 Other long term (current) drug therapy: Secondary | ICD-10-CM | POA: Insufficient documentation

## 2023-08-13 DIAGNOSIS — E039 Hypothyroidism, unspecified: Secondary | ICD-10-CM | POA: Diagnosis not present

## 2023-08-13 DIAGNOSIS — I10 Essential (primary) hypertension: Secondary | ICD-10-CM | POA: Insufficient documentation

## 2023-08-13 DIAGNOSIS — H539 Unspecified visual disturbance: Secondary | ICD-10-CM

## 2023-08-13 LAB — TSH: TSH: 0.03 u[IU]/mL — ABNORMAL LOW (ref 0.35–5.50)

## 2023-08-13 MED ORDER — FLUORESCEIN SODIUM 1 MG OP STRP
1.0000 | ORAL_STRIP | Freq: Once | OPHTHALMIC | Status: AC
  Administered 2023-08-13: 1 via OPHTHALMIC
  Filled 2023-08-13: qty 1

## 2023-08-13 MED ORDER — TETRACAINE HCL 0.5 % OP SOLN
2.0000 [drp] | Freq: Once | OPHTHALMIC | Status: AC
  Administered 2023-08-13: 2 [drp] via OPHTHALMIC
  Filled 2023-08-13: qty 4

## 2023-08-13 MED ORDER — LEVOTHYROXINE SODIUM 137 MCG PO TABS: 137.0000 ug | ORAL_TABLET | Freq: Every day | ORAL | 0 refills | Status: DC

## 2023-08-13 NOTE — ED Provider Notes (Cosign Needed Addendum)
[place] EMERGENCY DEPARTMENT AT [facility] Provider Note   CSN: [id number] Arrival date & time: 08/13/23  2154     History  Chief Complaint  Patient presents with   Visual Field Change    Mallory Collins is a 47 y.o. female history of iron deficiency anemia presented with left eye pain that began at 830 this evening.  Patient that she was watching TV when her left eye began having pixelated kaleidoscope vision that lasted for an hour and a half.  Patient states she did not have any headaches or jaw pain with the symptoms and denied any changes sensation/motor skills or new onset weakness of the right neck pain or headaches.  Patient states that she still has mild visual field changes in her left eye that Has gotten better.  Patient has not seen ophthalmologist in some time but does not use contacts.  Patient denies eye pain, eye discharge, eye color changes, eye redness, swelling around the eye, recent illnesses, or ear pain, sinus pressure, headache, jaw pain, flashes of light, photophobia, trauma, pain in dark rooms  Home Medications Prior to Admission medications   Medication Sig Start Date End Date Taking? Authorizing Provider  albuterol (VENTOLIN HFA) 108 (90 Base) MCG/ACT inhaler TAKE 2 PUFFS BY MOUTH EVERY 6 HOURS AS NEEDED FOR WHEEZE OR SHORTNESS OF BREATH 03/11/23   Burchette, Elberta Fortis, MD  cholecalciferol (VITAMIN D) 1000 units tablet Take 1,000 Units by mouth daily.    [provider]  cyclobenzaprine (FLEXERIL) 10 MG tablet Take 0.5-1 tablets (5-10 mg total) by mouth 3 (three) times daily as needed for muscle spasms. 04/20/23   Claiborne Rigg, NP  DULoxetine (CYMBALTA) 60 MG capsule TAKE 1 CAPSULE BY MOUTH EVERY DAY 03/11/23   Burchette, Elberta Fortis, MD  ferrous gluconate (FERGON) 324 MG tablet TAKE 1 TABLET BY MOUTH DAILY WITH BREAKFAST 02/15/23   Josph Macho, MD  levothyroxine (SYNTHROID) 137 MCG tablet Take 1 tablet (137 mcg total) by mouth daily  before breakfast. 08/13/23   Burchette, Elberta Fortis, MD  LORazepam (ATIVAN) 0.5 MG tablet Take 1 tablet (0.5 mg total) by mouth 2 (two) times daily. 01/29/23   Mozingo, Thereasa Solo, NP  Multiple Vitamin (MULTIVITAMIN) tablet Take 1 tablet by mouth daily.    [provider]  naproxen (NAPROSYN) 500 MG tablet Take 1 tablet (500 mg total) by mouth 2 (two) times daily with a meal. 04/20/23   Claiborne Rigg, NP  ondansetron (ZOFRAN) 4 MG tablet TAKE 1 TABLET BY MOUTH EVERY 8 HOURS AS NEEDED FOR NAUSEA AND VOMITING 03/11/23   Burchette, Elberta Fortis, MD  propranolol (INDERAL) 10 MG tablet TAKE 1 TABLET BY MOUTH EVERY DAY AS NEEDED 04/19/22   Burchette, Elberta Fortis, MD      Allergies    Patient has no known allergies.    Review of Systems   Review of Systems  Physical Exam Updated Vital Signs BP (!) 165/100   Pulse (!) 102   Temp 97.8 F (36.6 C) (Oral)   Resp 18   Wt 79.7 kg   SpO2 100%   BMI 28.36 kg/m  Physical Exam Constitutional:      General: She is not in acute distress. Eyes:     General: Lids are normal. Lids are everted, no foreign bodies appreciated. Vision grossly intact. Gaze aligned appropriately. No visual field deficit.       Right eye: No foreign body, discharge or hordeolum.  Left eye: No foreign body, discharge or hordeolum.     Extraocular Movements: Extraocular movements intact.     Conjunctiva/sclera: Conjunctivae normal.     Pupils: Pupils are equal, round, and reactive to light.     Right eye: No corneal abrasion or fluorescein uptake. Seidel exam negative.     Left eye: No corneal abrasion or fluorescein uptake. Seidel exam negative.    Comments: No erythema or edema noted around the eyes No periorbital ecchymosis noted Visual fields intact bilaterally No temporal artery tenderness to palpation  Neurological:     Mental Status: She is alert.     Sensory: Sensation is intact.     Motor: Motor function is intact.     Coordination: Coordination is  intact.     Gait: Gait is intact.     Comments: Sensation intact in all extremities CN III-XII intact Vision grossly intact     ED Results / Procedures / Treatments   Labs (all labs ordered are listed, but only abnormal results are displayed) Labs Reviewed - No data to display  EKG None  Radiology No results found.  Procedures Ultrasound ED Ocular  Date/Time: 08/13/2023 11:57 PM  Performed by: Netta Corrigan, PA-C Authorized by: Netta Corrigan, PA-C   PROCEDURE DETAILS:    Indications: evaluation for increased intracranial pressure     Assessed:  Left eye   Left eye axial view: obtained     Left eye saggital view: obtained     Images: archived     Limitations:  None LEFT EYE FINDINGS:     no foreign body noted in left eye    left eye lens not dislodged    no left eye increased optic nerve sheath diameter    no retrobulbar hematoma in left eye    no evidence of retinal detachment of the left eye    no ruptured globe in left eye    no vitreous hemorrhage in left eye   Optic nerve sheath diameter (mm):  4     Medications Ordered in ED Medications  tetracaine (PONTOCAINE) 0.5 % ophthalmic solution 2 drop (2 drops Left Eye Given 08/13/23 2312)  fluorescein ophthalmic strip 1 strip (1 strip Left Eye Given 08/13/23 2312)    ED Course/ Medical Decision Making/ A&P                                 Medical Decision Making Risk Prescription drug management.   Mallory Collins 47 y.o. presented today for left eye visual changes. Working DDx that I considered at this time includes, but not limited to, preorbital/orbital cellulitis, acute glaucoma, HSV infection, open globe, conjunctivitis, hordeolum/chalazion, FB, CRAO/CRVO, CVA/TIA.  R/o DDx: preorbital/orbital cellulitis, acute glaucoma, HSV infection, open globe, conjunctivitis, hordeolum/chalazion, FB, CRAO/CRVO, CVA/TIA, GCA: These are considered less likely due to history of present illness and physical exam  findings  Review of prior external notes: 06/20/2023 office visit  Unique Tests and My Interpretation:  Visual Acuity: Bilateral 20/20, right 20/25 left 20/20 IOP: 8 mmHg Fluoroscein Stain: No fluorescein uptake Ultrasound: No abnormalities noted, good color flow  Discussion with Independent Historian:  Mother  Discussion of Management of Tests: None  Risk: Low: based on diagnostic testing/clinical impression and treatment plan  Risk Stratification Score: None  Staffed with Horton, MD  Plan: On exam patient was in no acute distress with stable vitals.  EMR states that patient was tachycardic  102 with hypertension however in the room patient was not hypertensive heart rate was not tachycardic.  Patient's visual acuity is grossly intact along with visual fields.  Pupils were PERRL bilaterally and patient did not have any fluorescein stain uptake.  Ultrasounds conducted with this was ultimately reassuring and patient had good color flow and Doppler making CRAO unlikely.  Patient's optic nerve on the left side was also 4.1 mm so within the normal range.  Patient's visual acuity was also reassuring. Patient's history, symptoms and physical exam are also inconsistent with CVA/TIA and so will not proceed with imaging at this time. No tenderness to palpation of temporal artery or history of jaw claudication so doubt GCA. At this time I have low suspicion of any life-threatening diagnoses and will have the patient follow-up with an ophthalmologist.  Patient was given return precautions. Patient stable for discharge at this time.  Patient verbalized understanding of plan.  Final Clinical Impression(s) / ED Diagnoses Final diagnoses:  Visual changes    Rx / DC Orders ED Discharge Orders     None         Remi Deter 08/14/23 0856    Shon Baton, MD 08/15/23 [id number]

## 2023-08-13 NOTE — ED Provider Notes (Incomplete)
[name] EMERGENCY DEPARTMENT AT [facility] Provider Note   CSN: [id number] Arrival date & time: 08/13/23  2154     History {Add pertinent medical, surgical, social history, OB history to HPI:1} Chief Complaint  Patient presents with  . Visual Field Change    Mallory Collins is a 47 y.o. female history of iron deficiency anemia presented with left eye pain that began at 830 this evening.  Patient that she was watching TV when her left eye began having pixelated kaleidoscope vision that lasted for an hour and a half.  Patient states she did not have any headaches or dropped medication with the symptoms and denied any changes sensation/motor skills or new onset weakness of the right neck pain or headaches.  Patient states that she still has mild visual field changes in her left eye.  Has been better.  Patient has not seen ophthalmologist in some time but does not use contacts.  HPI     Home Medications Prior to Admission medications   Medication Sig Start Date End Date Taking? Authorizing Provider  albuterol (VENTOLIN HFA) 108 (90 Base) MCG/ACT inhaler TAKE 2 PUFFS BY MOUTH EVERY 6 HOURS AS NEEDED FOR WHEEZE OR SHORTNESS OF BREATH 03/11/23   Burchette, Elberta Fortis, MD  cholecalciferol (VITAMIN D) 1000 units tablet Take 1,000 Units by mouth daily.    [provider]  cyclobenzaprine (FLEXERIL) 10 MG tablet Take 0.5-1 tablets (5-10 mg total) by mouth 3 (three) times daily as needed for muscle spasms. 04/20/23   Claiborne Rigg, NP  DULoxetine (CYMBALTA) 60 MG capsule TAKE 1 CAPSULE BY MOUTH EVERY DAY 03/11/23   Burchette, Elberta Fortis, MD  ferrous gluconate (FERGON) 324 MG tablet TAKE 1 TABLET BY MOUTH DAILY WITH BREAKFAST 02/15/23   Josph Macho, MD  levothyroxine (SYNTHROID) 137 MCG tablet Take 1 tablet (137 mcg total) by mouth daily before breakfast. 08/13/23   Burchette, Elberta Fortis, MD  LORazepam (ATIVAN) 0.5 MG tablet Take 1 tablet (0.5 mg total) by mouth 2 (two) times  daily. 01/29/23   Mozingo, Thereasa Solo, NP  Multiple Vitamin (MULTIVITAMIN) tablet Take 1 tablet by mouth daily.    [provider]  naproxen (NAPROSYN) 500 MG tablet Take 1 tablet (500 mg total) by mouth 2 (two) times daily with a meal. 04/20/23   Claiborne Rigg, NP  ondansetron (ZOFRAN) 4 MG tablet TAKE 1 TABLET BY MOUTH EVERY 8 HOURS AS NEEDED FOR NAUSEA AND VOMITING 03/11/23   Burchette, Elberta Fortis, MD  propranolol (INDERAL) 10 MG tablet TAKE 1 TABLET BY MOUTH EVERY DAY AS NEEDED 04/19/22   Burchette, Elberta Fortis, MD      Allergies    Patient has no known allergies.    Review of Systems   Review of Systems  Physical Exam Updated Vital Signs BP (!) 165/100   Pulse (!) 102   Temp 97.8 F (36.6 C) (Oral)   Resp 18   Wt 79.7 kg   SpO2 100%   BMI 28.36 kg/m  Physical Exam Constitutional:      General: She is not in acute distress. Eyes:     General: Lids are normal. Lids are everted, no foreign bodies appreciated. Vision grossly intact. Gaze aligned appropriately. No visual field deficit.       Right eye: No foreign body, discharge or hordeolum.        Left eye: No foreign body, discharge or hordeolum.     Extraocular Movements: Extraocular movements intact.  Conjunctiva/sclera: Conjunctivae normal.     Pupils: Pupils are equal, round, and reactive to light.     Right eye: No corneal abrasion or fluorescein uptake. Seidel exam negative.     Left eye: No corneal abrasion or fluorescein uptake. Seidel exam negative.    Comments: No erythema or edema noted around the eyes No periorbital ecchymosis noted Visual fields intact bilaterally  Neurological:     Mental Status: She is alert.     ED Results / Procedures / Treatments   Labs (all labs ordered are listed, but only abnormal results are displayed) Labs Reviewed - No data to display  EKG None  Radiology No results found.  Procedures Ultrasound ED Ocular  Date/Time: 08/13/2023 11:57 PM  Performed by:  Netta Corrigan, PA-C Authorized by: Netta Corrigan, PA-C   PROCEDURE DETAILS:    Indications: evaluation for increased intracranial pressure     Assessed:  Left eye   Left eye axial view: obtained     Left eye saggital view: obtained     Images: archived     Limitations:  None LEFT EYE FINDINGS:     no foreign body noted in left eye    left eye lens not dislodged    no left eye increased optic nerve sheath diameter    no retrobulbar hematoma in left eye    no evidence of retinal detachment of the left eye    no ruptured globe in left eye    no vitreous hemorrhage in left eye   Optic nerve sheath diameter (mm):  4   {Document cardiac monitor, telemetry assessment procedure when appropriate:1}  Medications Ordered in ED Medications  tetracaine (PONTOCAINE) 0.5 % ophthalmic solution 2 drop (2 drops Left Eye Given 08/13/23 2312)  fluorescein ophthalmic strip 1 strip (1 strip Left Eye Given 08/13/23 2312)    ED Course/ Medical Decision Making/ A&P   {   Click here for ABCD2, HEART and other calculatorsREFRESH Note before signing :1}                              Medical Decision Making  Mallory Collins 47 y.o. presented today for left eye visual changes. Working DDx that I considered at this time includes, but not limited to, preorbital/orbital cellulitis, acute glaucoma, HSV infection, open globe, conjunctivitis, hordeolum/chalazion, FB, CRAO/CRVO.  R/o DDx: ***: These are considered less likely due to history of present illness and physical exam findings  Review of prior external notes: 06/20/2023 office visit  Unique Tests and My Interpretation:  Visual Acuity: *** IOP: *** Fluoroscein Stain: No fluorescein uptake Ultrasound: No abnormalities noted, good color flow  Discussion with Independent Historian:  Mother  Discussion of Management of Tests: {historian:29369}  Risk: {Risk:29370}  Risk Stratification Score: None  Staffed with ***  Plan: On exam  patient was in no acute distress with stable vitals.  EMR states that patient was tachycardic 102 with hypertension however in the room patient was not hypertensive heart rate was not tachycardic.  Patient's visual acuity is grossly intact along with visual fields.  Pupils were PERRL bilaterally and patient did not have any fluorescein stain uptake.  Ultrasounds conducted with this was ultimately reassuring and patient had good color flow and Doppler making CRAO unlikely.  Patient's optic nerve on the left side was also 4.1 mm so within the normal range.  Patient was given return precautions. Patient stable for discharge at this  time.  Patient verbalized understanding of plan.   {Document critical care time when appropriate:1} {Document review of labs and clinical decision tools ie heart score, Chads2Vasc2 etc:1}  {Document your independent review of radiology images, and any outside records:1} {Document your discussion with family members, caretakers, and with consultants:1} {Document social determinants of health affecting pt's care:1} {Document your decision making why or why not admission, treatments were needed:1} Final Clinical Impression(s) / ED Diagnoses Final diagnoses:  None    Rx / DC Orders ED Discharge Orders     None

## 2023-08-13 NOTE — Addendum Note (Signed)
Addended by: Christy Sartorius on: 08/13/2023 04:37 PM   Modules accepted: Orders

## 2023-08-13 NOTE — ED Triage Notes (Signed)
Pt in with L eye visual changes that began an hr ago while watching TV. Pt describes having "pixelated or kaleidoscope-appearing" vision in L eye. Denies any eye pain, HA or changes to R eye. No unilateral weakness or speech changes noted.

## 2023-08-14 NOTE — Discharge Instructions (Addendum)
Please follow-up with ophthalmologist I have attached your for you today or one of your choosing regards to recent ER visit and symptoms.  Today your physical exam was reassuring however you will need to have a specialist examine you for long-term management.  If symptoms change or worsen please return to ER.

## 2023-08-21 ENCOUNTER — Ambulatory Visit (INDEPENDENT_AMBULATORY_CARE_PROVIDER_SITE_OTHER): Payer: 59 | Admitting: Family Medicine

## 2023-08-21 ENCOUNTER — Encounter: Payer: Self-pay | Admitting: Family Medicine

## 2023-08-21 VITALS — BP 110/70 | HR 115 | Temp 98.1°F | Wt 176.0 lb

## 2023-08-21 DIAGNOSIS — R635 Abnormal weight gain: Secondary | ICD-10-CM

## 2023-08-21 DIAGNOSIS — M2550 Pain in unspecified joint: Secondary | ICD-10-CM

## 2023-08-21 DIAGNOSIS — Z833 Family history of diabetes mellitus: Secondary | ICD-10-CM

## 2023-08-21 MED ORDER — ZEPBOUND 2.5 MG/0.5ML ~~LOC~~ SOAJ: 2.5000 mg | SUBCUTANEOUS | 1 refills | Status: DC

## 2023-08-21 NOTE — Progress Notes (Unsigned)
Established Patient Office Visit  Subjective   Patient ID: Mallory Collins, female    DOB: [birth year]  Age: 47 y.o. MRN: [id number]  Chief Complaint  Patient presents with   feet pain    Ankles, legs x 2 weeks    HPI  {History (Optional):23778} Mallory Collins is seen with bilateral foot ankle and lower leg pain.  She states she particularly has a lot of pain when she first gets up in the morning but persists throughout the day.  She states that she thinks she has "fallen arches ".  She has taken naproxen and without much relief.  Denies any more generalized arthralgias.  No upper extremity, knee, or hip involvement.  She thinks the bigger problem is that she has had steady persistent weight gain.  She is tried very low calorie diets currently 1300 mg daily without any improvement.  Does 190-calorie soda a day but no other liquid calories.  She has scaled back sugars and starches.  Strong family history of type 2 diabetes in her father.  She has no personal history of known diabetes.  She has not noted any pitting edema in her legs.  No orthopnea.  No dyspnea with exertion.  She has hypothyroidism and recent TSH was over replaced with TSH 0.03.  We reduced her dose of levothyroxine and recommended follow-up in a couple of months  Past Medical History:  Diagnosis Date   Agoraphobia    Asthma    Chronic fatigue    GAD (generalized anxiety disorder)    Hypothyroidism    MDD (major depressive disorder)    Panic attacks    Past Surgical History:  Procedure Laterality Date   WISDOM TOOTH EXTRACTION      reports that she has never smoked. She has never used smokeless tobacco. She reports that she does not drink alcohol and does not use drugs. family history includes CAD (age of onset: 69) in her mother; Cancer in her mother; Heart disease in her paternal grandmother; Hypertension in her mother. No Known Allergies  Review of Systems  Constitutional:  Negative for malaise/fatigue.   Eyes:  Negative for blurred vision.  Respiratory:  Negative for shortness of breath.   Cardiovascular:  Negative for chest pain.  Neurological:  Negative for dizziness, weakness and headaches.      Objective:     BP 110/70 (BP Location: Left Arm, Patient Position: Sitting, Cuff Size: Large)   Pulse (!) 115   Temp 98.1 F (36.7 C) (Oral)   Wt 176 lb (79.8 kg)   LMP 03/30/2023 (Approximate)   SpO2 99%   BMI 28.41 kg/m  BP Readings from Last 3 Encounters:  08/21/23 110/70  08/13/23 (!) 154/85  04/30/23 120/80   Wt Readings from Last 3 Encounters:  08/21/23 176 lb (79.8 kg)  08/13/23 175 lb 11.3 oz (79.7 kg)  01/22/23 175 lb 12.8 oz (79.7 kg)      Physical Exam Vitals reviewed.  Constitutional:      Appearance: Normal appearance.  Cardiovascular:     Rate and Rhythm: Normal rate and regular rhythm.  Pulmonary:     Effort: Pulmonary effort is normal.     Breath sounds: Normal breath sounds.  Musculoskeletal:     Comments: Feet and ankles reveal no warmth or erythema.  No localized bony tenderness.  No evidence for ankle effusion.  No Achilles tenderness.  Neurological:     Mental Status: She is alert.      No results found for  any visits on 08/21/23.  {Labs (Optional):23779}  The 10-year ASCVD risk score (Arnett DK, et al., 2019) is: 0.7%    Assessment & Plan:   #1 polyarthralgias involving feet and ankles bilaterally with fairly sudden onset a couple weeks ago.  Etiology unclear.  No evidence for acute inflammation in her ankles or feet. -Check markers for inflammatory arthritis including C-reactive protein, sed rate, ANA, rheumatoid factor, CCP antibody  #2 progressive weight gain.  She has tried low calorie diets with no benefit or success.  She specifically had interest in GLP-1 medications.  She does have strong family history of type 2 diabetes.  Recheck A1c.  She does not have a history of pancreatitis or family history of thyroid cancer.  Consider  trial of Zepbound 2.5 mg once weekly subcutaneously.  We explained this may be challenging to get covered by insurance. If she is able to get started on this recommend follow-up feedback in a month to titrate dosage up to 5 mg.  Reviewed potential side effects.   No follow-ups on file.    Mallory Peat, MD

## 2023-08-22 ENCOUNTER — Other Ambulatory Visit (INDEPENDENT_AMBULATORY_CARE_PROVIDER_SITE_OTHER): Payer: 59

## 2023-08-22 DIAGNOSIS — M2550 Pain in unspecified joint: Secondary | ICD-10-CM | POA: Diagnosis not present

## 2023-08-22 DIAGNOSIS — E039 Hypothyroidism, unspecified: Secondary | ICD-10-CM

## 2023-08-22 DIAGNOSIS — Z833 Family history of diabetes mellitus: Secondary | ICD-10-CM | POA: Diagnosis not present

## 2023-08-22 LAB — C-REACTIVE PROTEIN: CRP: 1.1 mg/dL (ref 0.5–20.0)

## 2023-08-22 LAB — SEDIMENTATION RATE: Sed Rate: 44 mm/hr — ABNORMAL HIGH (ref 0–20)

## 2023-08-22 LAB — HEMOGLOBIN A1C: Hgb A1c MFr Bld: 5.5 % (ref 4.6–6.5)

## 2023-08-23 LAB — TSH: TSH: 0.02 u[IU]/mL — ABNORMAL LOW (ref 0.35–5.50)

## 2023-08-25 LAB — ANA: Anti Nuclear Antibody (ANA): POSITIVE — AB

## 2023-08-25 LAB — CYCLIC CITRUL PEPTIDE ANTIBODY, IGG: Cyclic Citrullin Peptide Ab: <16 U

## 2023-08-25 LAB — ANTI-NUCLEAR AB-TITER (ANA TITER)
ANA TITER: 1:80 {titer} — ABNORMAL HIGH
ANA Titer 1: 1:40 {titer} — ABNORMAL HIGH

## 2023-08-25 LAB — RHEUMATOID FACTOR: Rheumatoid fact SerPl-aCnc: <10 [IU]/mL (ref <14)

## 2023-08-26 ENCOUNTER — Telehealth: Payer: Self-pay | Admitting: Family Medicine

## 2023-08-26 NOTE — Addendum Note (Signed)
Addended by: Christy Sartorius on: 08/26/2023 03:24 PM   Modules accepted: Orders

## 2023-08-26 NOTE — Telephone Encounter (Signed)
Please see result note 

## 2023-08-26 NOTE — Telephone Encounter (Signed)
Pt call and stated she is returning your call and I let her know that you stated you will call her back.

## 2023-09-03 ENCOUNTER — Other Ambulatory Visit (HOSPITAL_COMMUNITY): Payer: Self-pay

## 2023-09-03 ENCOUNTER — Telehealth: Payer: Self-pay

## 2023-09-03 ENCOUNTER — Encounter: Payer: Self-pay | Admitting: Family

## 2023-09-03 NOTE — Telephone Encounter (Signed)
*  Primary  Pharmacy Patient Advocate Encounter   Received notification from CoverMyMeds that prior authorization for Zepbound 2.5MG /0.5ML pen-injectors  is required/requested.   Insurance verification completed.   The patient is insured through [employer] .   Per test claim: PA required; PA submitted to [employer] via CoverMyMeds Key/confirmation #/EOC [id number] Status is pending

## 2023-09-10 NOTE — Telephone Encounter (Signed)
Pharmacy Patient Advocate Encounter  Received notification from [employer] that Prior Authorization for Zepbound 2.5MG /0.5ML pen-injectors has been DENIED.  Full denial letter will be uploaded to the media tab. See denial reason below.  We are unable to approve this request for the following reason(s): Coverage is provided when at baseline: the patient had a body mass index of great than or equal to 30 kilograms per meter squared (30 kg/m2): or, when at baseline, the patient had a body mass index greater than or equal to 27 kg/m2 and the patient had, or patient currently has, at least one of the following weight-related co morbidities: hypertension, type 2 diabetes, dyslipidemia, obstructive pulmonary disease, metabolic-dysfunction associated steatotic liver disease/non-alcoholic fatty liver disease, polycystic ovarian syndrome, or coronary artery disease. This refers to baseline prior to any glucagon-like peptide-1 (GLP-1) agonist (for example, Saxenda, [id number]) or GLP-1/glucose-dependent insulinotropic polypeptide (GIP) receptor agonist (for example, Zepbound). Coverage cannot be authorized at this time.   PA #/Case ID/Reference #: [id number]  Please be advised we currently do not have a Pharmacist to review denials, therefore you will need to process appeals accordingly as needed. Thanks for your support at this time. Contact for appeals are as follows: Phone: [phone]), Fax: [phone]

## 2023-09-10 NOTE — Telephone Encounter (Signed)
Left detailed message on mobile phone voicemail informing patient of denial.

## 2023-10-09 ENCOUNTER — Encounter: Payer: Self-pay | Admitting: Medical Oncology

## 2023-10-09 ENCOUNTER — Inpatient Hospital Stay: Payer: 59 | Attending: Family

## 2023-10-09 ENCOUNTER — Other Ambulatory Visit: Payer: Self-pay

## 2023-10-09 ENCOUNTER — Inpatient Hospital Stay (HOSPITAL_BASED_OUTPATIENT_CLINIC_OR_DEPARTMENT_OTHER): Payer: 59 | Admitting: Medical Oncology

## 2023-10-09 VITALS — BP 133/83 | HR 99 | Temp 97.9°F | Resp 20 | Ht 65.0 in | Wt 176.1 lb

## 2023-10-09 DIAGNOSIS — Z8673 Personal history of transient ischemic attack (TIA), and cerebral infarction without residual deficits: Secondary | ICD-10-CM | POA: Diagnosis not present

## 2023-10-09 DIAGNOSIS — R5383 Other fatigue: Secondary | ICD-10-CM | POA: Insufficient documentation

## 2023-10-09 DIAGNOSIS — R002 Palpitations: Secondary | ICD-10-CM | POA: Insufficient documentation

## 2023-10-09 DIAGNOSIS — D75838 Other thrombocytosis: Secondary | ICD-10-CM | POA: Insufficient documentation

## 2023-10-09 DIAGNOSIS — R202 Paresthesia of skin: Secondary | ICD-10-CM | POA: Diagnosis not present

## 2023-10-09 DIAGNOSIS — D509 Iron deficiency anemia, unspecified: Secondary | ICD-10-CM | POA: Diagnosis present

## 2023-10-09 DIAGNOSIS — R200 Anesthesia of skin: Secondary | ICD-10-CM | POA: Diagnosis not present

## 2023-10-09 DIAGNOSIS — D500 Iron deficiency anemia secondary to blood loss (chronic): Secondary | ICD-10-CM

## 2023-10-09 LAB — CBC WITH DIFFERENTIAL (CANCER CENTER ONLY)
Abs Immature Granulocytes: 0.02 10*3/uL (ref 0.00–0.07)
Basophils Absolute: 0 10*3/uL (ref 0.0–0.1)
Basophils Relative: 0 %
Eosinophils Absolute: 0 10*3/uL (ref 0.0–0.5)
Eosinophils Relative: 0 %
HCT: 45 % (ref 36.0–46.0)
Hemoglobin: 14.7 g/dL (ref 12.0–15.0)
Immature Granulocytes: 0 %
Lymphocytes Relative: 28 %
Lymphs Abs: 2.4 10*3/uL (ref 0.7–4.0)
MCH: 28.4 pg (ref 26.0–34.0)
MCHC: 32.7 g/dL (ref 30.0–36.0)
MCV: 87 fL (ref 80.0–100.0)
Monocytes Absolute: 0.6 10*3/uL (ref 0.1–1.0)
Monocytes Relative: 7 %
Neutro Abs: 5.4 10*3/uL (ref 1.7–7.7)
Neutrophils Relative %: 65 %
Platelet Count: 416 10*3/uL — ABNORMAL HIGH (ref 150–400)
RBC: 5.17 MIL/uL — ABNORMAL HIGH (ref 3.87–5.11)
RDW: 13.1 % (ref 11.5–15.5)
WBC Count: 8.4 10*3/uL (ref 4.0–10.5)
nRBC: 0 % (ref 0.0–0.2)

## 2023-10-09 LAB — RETICULOCYTES
Immature Retic Fract: 5.7 % (ref 2.3–15.9)
RBC.: 5.12 MIL/uL — ABNORMAL HIGH (ref 3.87–5.11)
Retic Count, Absolute: 82.9 10*3/uL (ref 19.0–186.0)
Retic Ct Pct: 1.6 % (ref 0.4–3.1)

## 2023-10-09 LAB — IRON AND IRON BINDING CAPACITY (CC-WL,HP ONLY)
Iron: 74 ug/dL (ref 28–170)
Saturation Ratios: 25 % (ref 10.4–31.8)
TIBC: 295 ug/dL (ref 250–450)
UIBC: 221 ug/dL (ref 148–442)

## 2023-10-09 LAB — FERRITIN: Ferritin: 87 ng/mL (ref 11–307)

## 2023-10-09 NOTE — Progress Notes (Signed)
Hematology and Oncology Follow Up Visit  Mallory Collins [id number] [birth year] 47 y.o. 10/09/2023   Principle Diagnosis:  Iron deficiency with reactive mild thrombocytosis    Current Therapy:        Oral [facility] [place]) iron PO daily IV iron as indicated    Interim History:  [name] is here today for follow-up.   She reports that her fatigue is stable.  Her cycle has stopped as of February. Mother went through menopause around 92. She sees them in November for evaluation.  She is taking her Faragon oral iron supplement once daily and tolerating nicely.  She still has the numbness and tingling in the scalp, left neck and left arm. This is stable. Vascular surgery follows her and did not think that she had a TIA in the past.  No falls or syncope.  No fever, n/v, cough, rash, SOB, chest pain, abdominal pain or changes in bowel or bladder habits.  She has occasional palpitations with history of SVT.  Appetite and hydration are good.   ECOG Performance Status: 1 - Symptomatic but completely ambulatory  Medications:  Allergies as of 10/09/2023   No Known Allergies      Medication List        Accurate as of October 09, 2023  1:12 PM. If you have any questions, ask your nurse or doctor.          STOP taking these medications    cyclobenzaprine 10 MG tablet Commonly known as: FLEXERIL Stopped by: Rushie Chestnut   DULoxetine 60 MG capsule Commonly known as: CYMBALTA Stopped by: Rushie Chestnut   naproxen 500 MG tablet Commonly known as: Naprosyn Stopped by: Rushie Chestnut       TAKE these medications    albuterol 108 (90 Base) MCG/ACT inhaler Commonly known as: VENTOLIN HFA TAKE 2 PUFFS BY MOUTH EVERY 6 HOURS AS NEEDED FOR WHEEZE OR SHORTNESS OF BREATH   cholecalciferol 1000 units tablet Commonly known as: VITAMIN D Take 1,000 Units by mouth daily.   ferrous gluconate 324 MG tablet Commonly known as: FERGON TAKE 1 TABLET BY MOUTH DAILY WITH  BREAKFAST   levothyroxine 137 MCG tablet Commonly known as: SYNTHROID Take 1 tablet (137 mcg total) by mouth daily before breakfast.   LORazepam 0.5 MG tablet Commonly known as: ATIVAN Take 1 tablet (0.5 mg total) by mouth 2 (two) times daily.   multivitamin tablet Take 1 tablet by mouth daily.   ondansetron 4 MG tablet Commonly known as: ZOFRAN TAKE 1 TABLET BY MOUTH EVERY 8 HOURS AS NEEDED FOR NAUSEA AND VOMITING   propranolol 10 MG tablet Commonly known as: INDERAL TAKE 1 TABLET BY MOUTH EVERY DAY AS NEEDED   Zepbound 2.5 MG/0.5ML Pen Generic drug: tirzepatide Inject 2.5 mg into the skin once a week.        Allergies: No Known Allergies  Past Medical History, Surgical history, Social history, and Family History were reviewed and updated.  Review of Systems: All other 10 point review of systems is negative.   Physical Exam:  height is 5\' 5"  (1.651 m) and weight is 176 lb 1.3 oz (79.9 kg). Her oral temperature is 97.9 F (36.6 C). Her blood pressure is 133/83 and her pulse is 99. Her respiration is 20 and oxygen saturation is 100%.   Wt Readings from Last 3 Encounters:  10/09/23 176 lb 1.3 oz (79.9 kg)  08/21/23 176 lb (79.8 kg)  08/13/23 175 lb 11.3 oz (79.7 kg)  Ocular: Sclerae unicteric, pupils equal, round and reactive to light Ear-nose-throat: Oropharynx clear, dentition fair Lymphatic: No cervical or supraclavicular adenopathy Lungs no rales or rhonchi, good excursion bilaterally Heart regular rate and rhythm, no murmur appreciated Abd soft, nontender, positive bowel sounds MSK no focal spinal tenderness, no joint edema Neuro: non-focal, well-oriented, appropriate affect   Lab Results  Component Value Date   WBC 8.4 10/09/2023   HGB 14.7 10/09/2023   HCT 45.0 10/09/2023   MCV 87.0 10/09/2023   PLT 416 (H) 10/09/2023   Lab Results  Component Value Date   FERRITIN 88 01/22/2023   IRON 138 01/22/2023   TIBC 298 01/22/2023   UIBC 160  01/22/2023   IRONPCTSAT 46 (H) 01/22/2023   Lab Results  Component Value Date   RETICCTPCT 1.6 10/09/2023   RBC 5.12 (H) 10/09/2023   RBC 5.17 (H) 10/09/2023   No results found for: "KPAFRELGTCHN", "LAMBDASER", "KAPLAMBRATIO" No results found for: "IGGSERUM", "IGA", "IGMSERUM" No results found for: "TOTALPROTELP", "ALBUMINELP", "A1GS", "A2GS", "BETS", "BETA2SER", "GAMS", "MSPIKE", "SPEI"   Chemistry      Component Value Date/Time   NA 141 10/22/2022 1337   K 4.3 10/22/2022 1337   CL 107 10/22/2022 1337   CO2 29 10/22/2022 1337   BUN 8 10/22/2022 1337   CREATININE 0.75 10/22/2022 1337   CREATININE 0.81 06/26/2022 1420      Component Value Date/Time   CALCIUM 8.9 10/22/2022 1337   ALKPHOS 78 06/26/2022 1420   AST 14 (L) 06/26/2022 1420   ALT 6 06/26/2022 1420   BILITOT 0.4 06/26/2022 1420       Impression and Plan:  Ms. Tarman is a very pleasant 47 yo caucasian female for mild thrombocytosis secondary to iron deficiency.   Labs appear stable at this time. Iron studies are pending.  RTC 4 months APP, labs    Rushie Chestnut, [place] 10/9/20241:12 PM

## 2023-11-06 ENCOUNTER — Other Ambulatory Visit: Payer: Self-pay | Admitting: Adult Health

## 2023-11-06 DIAGNOSIS — F4000 Agoraphobia, unspecified: Secondary | ICD-10-CM

## 2023-11-06 DIAGNOSIS — F410 Panic disorder [episodic paroxysmal anxiety] without agoraphobia: Secondary | ICD-10-CM

## 2023-11-06 DIAGNOSIS — F411 Generalized anxiety disorder: Secondary | ICD-10-CM

## 2023-11-07 NOTE — Telephone Encounter (Signed)
Needs an appt, sent Admin a message with another RF to schedule.

## 2023-11-08 NOTE — Telephone Encounter (Signed)
Please call to schedule F/U.  ?

## 2023-12-12 ENCOUNTER — Telehealth (INDEPENDENT_AMBULATORY_CARE_PROVIDER_SITE_OTHER): Payer: 59 | Admitting: Adult Health

## 2023-12-12 ENCOUNTER — Encounter: Payer: Self-pay | Admitting: Adult Health

## 2023-12-12 DIAGNOSIS — F331 Major depressive disorder, recurrent, moderate: Secondary | ICD-10-CM

## 2023-12-12 DIAGNOSIS — F4001 Agoraphobia with panic disorder: Secondary | ICD-10-CM

## 2023-12-12 DIAGNOSIS — F410 Panic disorder [episodic paroxysmal anxiety] without agoraphobia: Secondary | ICD-10-CM

## 2023-12-12 DIAGNOSIS — F411 Generalized anxiety disorder: Secondary | ICD-10-CM

## 2023-12-12 MED ORDER — DULOXETINE HCL 20 MG PO CPEP: ORAL_CAPSULE | ORAL | 5 refills | Status: DC

## 2023-12-12 MED ORDER — LORAZEPAM 0.5 MG PO TABS
0.5000 mg | ORAL_TABLET | Freq: Two times a day (BID) | ORAL | 2 refills | Status: DC
Start: 2023-12-12 — End: 2024-06-30

## 2023-12-12 NOTE — Progress Notes (Signed)
Mallory Collins [id number] [birth year] 47 y.o.  Virtual Visit via Video Note  I connected with pt @ on 12/12/23 at  2:40 PM EST by a video enabled telemedicine application and verified that I am speaking with the correct person using two identifiers.   I discussed the limitations of evaluation and management by telemedicine and the availability of in person appointments. The patient expressed understanding and agreed to proceed.  I discussed the assessment and treatment plan with the patient. The patient was provided an opportunity to ask questions and all were answered. The patient agreed with the plan and demonstrated an understanding of the instructions.   The patient was advised to call back or seek an in-person evaluation if the symptoms worsen or if the condition fails to improve as anticipated.  I provided 15 minutes of non-face-to-face time during this encounter.  The patient was located at home.  The provider was located at [facility].   Dorothyann Gibbs, NP   Subjective:   Patient ID:  Mallory Collins is a 47 y.o. (DOB 04-20-1976) female.  Chief Complaint: No chief complaint on file.   HPI Yulonda Nasby presents for follow-up of GAD, MDD, panic attacks, and agoraphobia.   Describes mood today as "ok". Denies tearfulness. Mood symptoms - denies depression and irritability. Reports some situational anxiety. Denies recent panic attacks. Denies worry, rumination, and over thinking. Mood is stable. Stating "I feel like I'm doing ok". Feels like the Ativan works well when needed. Varying interest and motivation. Taking medications as prescribed.  Energy levels stable. Active, does not have a regular exercise routine.  Enjoys some usual interests and activities. Married. Lives with husband and mother - 2 dogs. Spending time with family. Appetite adequate. Weight loss - 162 to 165 pounds.  Sleeps well most nights. Averages 9 - 10 hours. Focus and  concentration stable. Completing tasks. Managing aspects of household. Unable to work - Fibromyalgia. Denies SI or HI.  Denies AH or VH. Denies self harm. Denies substance use.  Previous medication trials: Clonazepam - too sedating, Zoloft    Review of Systems:  Review of Systems  Musculoskeletal:  Negative for gait problem.  Neurological:  Negative for tremors.  Psychiatric/Behavioral:         Please refer to HPI    Medications: I have reviewed the patient's current medications.  Current Outpatient Medications  Medication Sig Dispense Refill   albuterol (VENTOLIN HFA) 108 (90 Base) MCG/ACT inhaler TAKE 2 PUFFS BY MOUTH EVERY 6 HOURS AS NEEDED FOR WHEEZE OR SHORTNESS OF BREATH 8.5 each 1   cholecalciferol (VITAMIN D) 1000 units tablet Take 1,000 Units by mouth daily.     ferrous gluconate (FERGON) 324 MG tablet TAKE 1 TABLET BY MOUTH DAILY WITH BREAKFAST 90 tablet 1   levothyroxine (SYNTHROID) 137 MCG tablet Take 1 tablet (137 mcg total) by mouth daily before breakfast. 90 tablet 0   LORazepam (ATIVAN) 0.5 MG tablet Take 1 tablet (0.5 mg total) by mouth 2 (two) times daily. 60 tablet 2   Multiple Vitamin (MULTIVITAMIN) tablet Take 1 tablet by mouth daily.     ondansetron (ZOFRAN) 4 MG tablet TAKE 1 TABLET BY MOUTH EVERY 8 HOURS AS NEEDED FOR NAUSEA AND VOMITING 9 tablet 1   propranolol (INDERAL) 10 MG tablet TAKE 1 TABLET BY MOUTH EVERY DAY AS NEEDED 90 tablet 0   tirzepatide (ZEPBOUND) 2.5 MG/0.5ML Pen Inject 2.5 mg into the skin once a week. 2 mL 1   No  current facility-administered medications for this visit.    Medication Side Effects: None  Allergies: No Known Allergies  Past Medical History:  Diagnosis Date   Agoraphobia    Asthma    Chronic fatigue    GAD (generalized anxiety disorder)    Hypothyroidism    MDD (major depressive disorder)    Panic attacks     Family History  Problem Relation Age of Onset   Hypertension Mother    Cancer Mother        breast    CAD Mother 71       CABG, stents   Heart disease Paternal Grandmother     Social History   Socioeconomic History   Marital status: Married    Spouse name: Not on file   Number of children: Not on file   Years of education: Not on file   Highest education level: Some college, no degree  Occupational History   Not on file  Tobacco Use   Smoking status: Never   Smokeless tobacco: Never  Vaping Use   Vaping status: Never Used  Substance and Sexual Activity   Alcohol use: Never   Drug use: Never   Sexual activity: Not on file  Other Topics Concern   Not on file  Social History Narrative   Not on file   Social Drivers of Health   Financial Resource Strain: Patient Declined (11/25/2022)   Overall Financial Resource Strain (CARDIA)    Difficulty of Paying Living Expenses: Patient declined  Food Insecurity: Patient Declined (11/25/2022)   Hunger Vital Sign    Worried About Running Out of Food in the Last Year: Patient declined    Ran Out of Food in the Last Year: Patient declined  Transportation Needs: No Transportation Needs (11/25/2022)   PRAPARE - Administrator, Civil Service (Medical): No    Lack of Transportation (Non-Medical): No  Physical Activity: Unknown (11/25/2022)   Exercise Vital Sign    Days of Exercise per Week: 0 days    Minutes of Exercise per Session: Not on file  Stress: No Stress Concern Present (11/25/2022)   [employer] of Occupational Health - Occupational Stress Questionnaire    Feeling of Stress : Only a little  Social Connections: Unknown (11/25/2022)   Social Connection and Isolation Panel [NHANES]    Frequency of Communication with Friends and Family: Patient declined    Frequency of Social Gatherings with Friends and Family: Patient declined    Attends Religious Services: Patient declined    Database administrator or Organizations: Patient declined    Attends Engineer, structural: Not on file    Marital Status:  Married  Catering manager Violence: Not on file    Past Medical History, Surgical history, Social history, and Family history were reviewed and updated as appropriate.   Please see review of systems for further details on the patient's review from today.   Objective:   Physical Exam:  There were no vitals taken for this visit.  Physical Exam Constitutional:      General: She is not in acute distress. Musculoskeletal:        General: No deformity.  Neurological:     Mental Status: She is alert and oriented to person, place, and time.     Coordination: Coordination normal.  Psychiatric:        Attention and Perception: Attention and perception normal. She does not perceive auditory or visual hallucinations.  Mood and Affect: Affect is not labile, blunt, angry or inappropriate.        Speech: Speech normal.        Behavior: Behavior normal.        Thought Content: Thought content normal. Thought content is not paranoid or delusional. Thought content does not include homicidal or suicidal ideation. Thought content does not include homicidal or suicidal plan.        Cognition and Memory: Cognition and memory normal.        Judgment: Judgment normal.     Comments: Insight intact     Lab Review:     Component Value Date/Time   NA 141 10/22/2022 1337   K 4.3 10/22/2022 1337   CL 107 10/22/2022 1337   CO2 29 10/22/2022 1337   GLUCOSE 111 (H) 10/22/2022 1337   BUN 8 10/22/2022 1337   CREATININE 0.75 10/22/2022 1337   CREATININE 0.81 06/26/2022 1420   CALCIUM 8.9 10/22/2022 1337   PROT 7.2 06/26/2022 1420   ALBUMIN 4.3 06/26/2022 1420   AST 14 (L) 06/26/2022 1420   ALT 6 06/26/2022 1420   ALKPHOS 78 06/26/2022 1420   BILITOT 0.4 06/26/2022 1420   GFRNONAA >60 10/22/2022 1337   GFRNONAA >60 06/26/2022 1420   GFRAA  05/22/2010 0100    >60        The eGFR has been calculated using the MDRD equation. This calculation has not been validated in all  clinical situations. eGFR's persistently <60 mL/min signify possible Chronic Kidney Disease.       Component Value Date/Time   WBC 8.4 10/09/2023 1125   WBC 9.2 10/22/2022 1337   RBC 5.12 (H) 10/09/2023 1125   RBC 5.17 (H) 10/09/2023 1125   HGB 14.7 10/09/2023 1125   HCT 45.0 10/09/2023 1125   PLT 416 (H) 10/09/2023 1125   MCV 87.0 10/09/2023 1125   MCH 28.4 10/09/2023 1125   MCHC 32.7 10/09/2023 1125   RDW 13.1 10/09/2023 1125   LYMPHSABS 2.4 10/09/2023 1125   MONOABS 0.6 10/09/2023 1125   EOSABS 0.0 10/09/2023 1125   BASOSABS 0.0 10/09/2023 1125    No results found for: "POCLITH", "LITHIUM"   No results found for: "PHENYTOIN", "PHENOBARB", "VALPROATE", "CBMZ"   .res Assessment: Plan:    Plan:  PDMP reviewed  1. Ativan 0.5mg  daily as needed for anxiety/agoraphobia  Decrease Cymbalta - 60mg  to 40mg  daily for fibromyalgia - patient would like to try and taper down on dose and see how she does.  RTC 3 months   Patient advised to contact office with any questions, adverse effects, or acute worsening in signs and symptoms.  Discussed potential benefits, risk, and side effects of benzodiazepines to include potential risk of tolerance and dependence, as well as possible drowsiness.  Advised patient not to drive if experiencing drowsiness and to take lowest possible effective dose to minimize risk of dependence and tolerance.  Diagnoses and all orders for this visit:  Major depressive disorder, recurrent episode, moderate (HCC)  Generalized anxiety disorder  Panic attacks  Agoraphobia     Please see After Visit Summary for patient specific instructions.  Future Appointments  Date Time Provider Department Center  02/10/2024 11:00 AM [facility] None  02/10/2024 11:30 AM Rushie Chestnut, PA-C [facility] None  02/26/2024 10:20 AM Pollyann Savoy, MD [facility] None  03/18/2024  1:20 PM Pollyann Savoy, MD [facility] None    No orders of the defined types  were placed in this encounter.     -------------------------------

## 2024-01-03 NOTE — Telephone Encounter (Signed)
 Seen 12-*12-24

## 2024-01-04 ENCOUNTER — Other Ambulatory Visit: Payer: Self-pay | Admitting: Adult Health

## 2024-01-04 DIAGNOSIS — F331 Major depressive disorder, recurrent, moderate: Secondary | ICD-10-CM

## 2024-01-04 DIAGNOSIS — F411 Generalized anxiety disorder: Secondary | ICD-10-CM

## 2024-02-03 ENCOUNTER — Encounter: Payer: Self-pay | Admitting: Family

## 2024-02-10 ENCOUNTER — Inpatient Hospital Stay: Payer: BC Managed Care – PPO | Attending: Hematology & Oncology

## 2024-02-10 ENCOUNTER — Inpatient Hospital Stay (HOSPITAL_BASED_OUTPATIENT_CLINIC_OR_DEPARTMENT_OTHER): Payer: BC Managed Care – PPO | Admitting: Medical Oncology

## 2024-02-10 ENCOUNTER — Encounter: Payer: Self-pay | Admitting: Medical Oncology

## 2024-02-10 ENCOUNTER — Encounter: Payer: Self-pay | Admitting: Family

## 2024-02-10 VITALS — BP 116/80 | HR 107 | Temp 98.2°F | Resp 18 | Ht 65.0 in | Wt 154.1 lb

## 2024-02-10 DIAGNOSIS — R202 Paresthesia of skin: Secondary | ICD-10-CM | POA: Diagnosis not present

## 2024-02-10 DIAGNOSIS — E538 Deficiency of other specified B group vitamins: Secondary | ICD-10-CM | POA: Diagnosis not present

## 2024-02-10 DIAGNOSIS — R5383 Other fatigue: Secondary | ICD-10-CM | POA: Insufficient documentation

## 2024-02-10 DIAGNOSIS — R002 Palpitations: Secondary | ICD-10-CM | POA: Diagnosis not present

## 2024-02-10 DIAGNOSIS — D75839 Thrombocytosis, unspecified: Secondary | ICD-10-CM | POA: Diagnosis not present

## 2024-02-10 DIAGNOSIS — G9332 Myalgic encephalomyelitis/chronic fatigue syndrome: Secondary | ICD-10-CM | POA: Diagnosis not present

## 2024-02-10 DIAGNOSIS — D509 Iron deficiency anemia, unspecified: Secondary | ICD-10-CM | POA: Insufficient documentation

## 2024-02-10 DIAGNOSIS — Z79899 Other long term (current) drug therapy: Secondary | ICD-10-CM | POA: Insufficient documentation

## 2024-02-10 DIAGNOSIS — D500 Iron deficiency anemia secondary to blood loss (chronic): Secondary | ICD-10-CM

## 2024-02-10 DIAGNOSIS — Z8673 Personal history of transient ischemic attack (TIA), and cerebral infarction without residual deficits: Secondary | ICD-10-CM | POA: Diagnosis not present

## 2024-02-10 DIAGNOSIS — R200 Anesthesia of skin: Secondary | ICD-10-CM | POA: Insufficient documentation

## 2024-02-10 DIAGNOSIS — D8989 Other specified disorders involving the immune mechanism, not elsewhere classified: Secondary | ICD-10-CM

## 2024-02-10 LAB — IRON AND IRON BINDING CAPACITY (CC-WL,HP ONLY)
Iron: 78 ug/dL (ref 28–170)
Saturation Ratios: 30 % (ref 10.4–31.8)
TIBC: 265 ug/dL (ref 250–450)
UIBC: 187 ug/dL (ref 148–442)

## 2024-02-10 LAB — CBC WITH DIFFERENTIAL (CANCER CENTER ONLY)
Abs Immature Granulocytes: 0.03 10*3/uL (ref 0.00–0.07)
Basophils Absolute: 0 10*3/uL (ref 0.0–0.1)
Basophils Relative: 0 %
Eosinophils Absolute: 0 10*3/uL (ref 0.0–0.5)
Eosinophils Relative: 0 %
HCT: 44.5 % (ref 36.0–46.0)
Hemoglobin: 14.9 g/dL (ref 12.0–15.0)
Immature Granulocytes: 0 %
Lymphocytes Relative: 23 %
Lymphs Abs: 2.2 10*3/uL (ref 0.7–4.0)
MCH: 29.3 pg (ref 26.0–34.0)
MCHC: 33.5 g/dL (ref 30.0–36.0)
MCV: 87.4 fL (ref 80.0–100.0)
Monocytes Absolute: 0.7 10*3/uL (ref 0.1–1.0)
Monocytes Relative: 7 %
Neutro Abs: 6.6 10*3/uL (ref 1.7–7.7)
Neutrophils Relative %: 70 %
Platelet Count: 479 10*3/uL — ABNORMAL HIGH (ref 150–400)
RBC: 5.09 MIL/uL (ref 3.87–5.11)
RDW: 13.1 % (ref 11.5–15.5)
WBC Count: 9.5 10*3/uL (ref 4.0–10.5)
nRBC: 0 % (ref 0.0–0.2)

## 2024-02-10 LAB — VITAMIN B12: Vitamin B-12: 215 pg/mL (ref 180–914)

## 2024-02-10 LAB — FERRITIN: Ferritin: 174 ng/mL (ref 11–307)

## 2024-02-10 LAB — RETICULOCYTES
Immature Retic Fract: 7.8 % (ref 2.3–15.9)
RBC.: 5.1 MIL/uL (ref 3.87–5.11)
Retic Count, Absolute: 78.5 10*3/uL (ref 19.0–186.0)
Retic Ct Pct: 1.5 % (ref 0.4–3.1)

## 2024-02-10 NOTE — Progress Notes (Signed)
 Hematology and Oncology Follow Up Visit  Mallory Collins [id number] 04-23-1976 48 y.o. 02/10/2024   Principle Diagnosis:  Iron  deficiency with reactive mild thrombocytosis    Current Therapy:        Oral (Faragon) iron  PO daily IV iron  as indicated    Interim History:  [name] is here today for follow-up.   She reports that her fatigue is stable.  Her cycle has returned (previously had been absent for many months). Since the return of her menstrual cycles her fatigue has worsened.  She is taking her Faragon oral iron  supplement most days but has now switched to taking it in the afternoons. She does say she has missed a few doses.  She still has the numbness and tingling in the scalp, left neck and left arm. This is stable. Vascular surgery follows her and did not think that she had a TIA in the past. She does have an appointment with a neurologist soon to go over this.  No falls or syncope.  No fever, n/v, cough, rash, SOB, chest pain, abdominal pain or changes in bowel or bladder habits.  She has occasional palpitations with history of SVT.  Appetite and hydration are good.   ECOG Performance Status: 1 - Symptomatic but completely ambulatory  Medications:  Allergies as of 02/10/2024   No Known Allergies      Medication List        Accurate as of February 10, 2024 11:43 AM. If you have any questions, ask your nurse or doctor.          albuterol  108 (90 Base) MCG/ACT inhaler Commonly known as: VENTOLIN  HFA TAKE 2 PUFFS BY MOUTH EVERY 6 HOURS AS NEEDED FOR WHEEZE OR SHORTNESS OF BREATH   cholecalciferol 1000 units tablet Commonly known as: VITAMIN D  Take 1,000 Units by mouth daily.   DULoxetine  20 MG capsule Commonly known as: CYMBALTA  TAKE 2 CAPSULES BY MOUTH EVERY DAY   ferrous gluconate  324 MG tablet Commonly known as: FERGON TAKE 1 TABLET BY MOUTH DAILY WITH BREAKFAST   levothyroxine  137 MCG tablet Commonly known as: SYNTHROID  Take 1 tablet (137 mcg  total) by mouth daily before breakfast.   LORazepam  0.5 MG tablet Commonly known as: ATIVAN  Take 1 tablet (0.5 mg total) by mouth 2 (two) times daily.   multivitamin tablet Take 1 tablet by mouth daily.   ondansetron  4 MG tablet Commonly known as: ZOFRAN  TAKE 1 TABLET BY MOUTH EVERY 8 HOURS AS NEEDED FOR NAUSEA AND VOMITING   propranolol  10 MG tablet Commonly known as: INDERAL  TAKE 1 TABLET BY MOUTH EVERY DAY AS NEEDED   Zepbound  2.5 MG/0.5ML Pen Generic drug: tirzepatide  Inject 2.5 mg into the skin once a week.        Allergies: No Known Allergies  Past Medical History, Surgical history, Social history, and Family History were reviewed and updated.  Review of Systems: All other 10 point review of systems is negative.   Physical Exam:  height is 5\' 5"  (1.651 m) and weight is 154 lb 1.9 oz (69.9 kg). Her oral temperature is 98.2 F (36.8 C). Her blood pressure is 116/80 and her pulse is 107 (abnormal). Her respiration is 18 and oxygen saturation is 100%.   Wt Readings from Last 3 Encounters:  02/10/24 154 lb 1.9 oz (69.9 kg)  10/09/23 176 lb 1.3 oz (79.9 kg)  08/21/23 176 lb (79.8 kg)    Ocular: Sclerae unicteric, pupils equal, round and reactive to light Ear-nose-throat: Oropharynx clear,  dentition fair Lymphatic: No cervical or supraclavicular adenopathy Lungs no rales or rhonchi, good excursion bilaterally Heart regular rate and rhythm, no murmur appreciated Abd soft, nontender, positive bowel sounds MSK no focal spinal tenderness, no joint edema Neuro: non-focal, well-oriented, appropriate affect   Lab Results  Component Value Date   WBC 9.5 02/10/2024   HGB 14.9 02/10/2024   HCT 44.5 02/10/2024   MCV 87.4 02/10/2024   PLT 479 (H) 02/10/2024   Lab Results  Component Value Date   FERRITIN 87 10/09/2023   IRON  74 10/09/2023   TIBC 295 10/09/2023   UIBC 221 10/09/2023   IRONPCTSAT 25 10/09/2023   Lab Results  Component Value Date   RETICCTPCT 1.5  02/10/2024   RBC 5.10 02/10/2024   RBC 5.09 02/10/2024   No results found for: "KPAFRELGTCHN", "LAMBDASER", "KAPLAMBRATIO" No results found for: "IGGSERUM", "IGA", "IGMSERUM" No results found for: "TOTALPROTELP", "ALBUMINELP", "A1GS", "A2GS", "BETS", "BETA2SER", "GAMS", "MSPIKE", "SPEI"   Chemistry      Component Value Date/Time   NA 141 10/22/2022 1337   K 4.3 10/22/2022 1337   CL 107 10/22/2022 1337   CO2 29 10/22/2022 1337   BUN 8 10/22/2022 1337   CREATININE 0.75 10/22/2022 1337   CREATININE 0.81 06/26/2022 1420      Component Value Date/Time   CALCIUM 8.9 10/22/2022 1337   ALKPHOS 78 06/26/2022 1420   AST 14 (L) 06/26/2022 1420   ALT 6 06/26/2022 1420   BILITOT 0.4 06/26/2022 1420     Encounter Diagnoses  Name Primary?   Iron  deficiency anemia due to chronic blood loss Yes   Thrombocytosis    Chronic fatigue and immune dysfunction syndrome (HCC)     Impression and Plan:  [name] is a very pleasant 48 yo caucasian female for mild thrombocytosis secondary to iron  deficiency from heavy menses.   Adding B12 on today due to fatigue, platelet abnormality, tingling sensation of scalp.  CBC shows a normal Hgb of 14.9. Platelets are up a bit to 479 today. Reticulocytes are normal.  Iron  studies are pending.   Adding B12 RTC 4 months APP, labs (CBC w/, CMP, LDH, save smear, folate, iron , ferritin, retic, B12)    Sharla Davis, PA-C 2/10/202511:43 AM

## 2024-02-11 ENCOUNTER — Encounter: Payer: Self-pay | Admitting: Medical Oncology

## 2024-02-12 NOTE — Progress Notes (Signed)
 Office Visit Note  Patient: Mallory Collins             Date of Birth: 11-08-1976           MRN: [id number]             PCP: Kristian Covey, MD Referring: Kristian Covey, MD Visit Date: 02/26/2024 Occupation: @GUAROCC @  Subjective:  Pain in hands and feet   History of Present Illness: Mallory Collins is a 48 y.o. right-handed female seen in consultation per request of her PCP for the evaluation of joint pain and positive ANA.  Patient states that she has been having increased joint pain and discomfort for the last 1 year.  She states the pain is mostly in her hands and her feet.  She not experience any morning stiffness but she has discomfort in her feet.  She also gives history of some discomfort in her upper back, lower back, shoulders, wrist, knees and her ankles.  She has not noticed any joint swelling.  She was diagnosed with fibromyalgia syndrome about 20 years ago.  She experiences generalized achiness.  She gets flares of fibromyalgia about every once every 2 months.  She is on Cymbalta 20 mg daily for fibromyalgia.  There is no history of oral ulcers, nasal ulcers, sicca symptoms, malar rash, photosensitivity, Raynaud's or lymphadenopathy.  She is right-handed currently unemployed.  She enjoys reading and works out on a regular basis.  She is married she is not using any contraception.  She is gravida 2, para 0, miscarriage 1, abortion 1.  There is no history of DVTs.  She does not drink any alcohol or smoke.  There is history of rheumatoid arthritis in maternal aunt and history of MS and her cousin on maternal side.    Activities of Daily Living:  Patient reports morning stiffness for 0  none .   Patient Denies nocturnal pain.  Difficulty dressing/grooming: Denies Difficulty climbing stairs: Denies Difficulty getting out of chair: Denies Difficulty using hands for taps, buttons, cutlery, and/or writing: Reports  Review of Systems  Constitutional:  Positive for  fatigue.  HENT:  Negative for mouth sores and mouth dryness.   Eyes:  Negative for dryness.  Respiratory:  Negative for shortness of breath.   Cardiovascular:  Negative for chest pain and palpitations.  Gastrointestinal:  Negative for blood in stool, constipation and diarrhea.  Endocrine: Negative for increased urination.  Genitourinary:  Negative for involuntary urination.  Musculoskeletal:  Positive for joint pain, joint pain, joint swelling, myalgias, muscle weakness, muscle tenderness and myalgias. Negative for gait problem and morning stiffness.  Skin:  Negative for color change, rash, hair loss and sensitivity to sunlight.  Allergic/Immunologic: Negative for susceptible to infections.  Neurological:  Negative for dizziness and headaches.  Hematological:  Negative for swollen glands.  Psychiatric/Behavioral:  Negative for depressed mood and sleep disturbance. The patient is not nervous/anxious.     PMFS History:  Patient Active Problem List   Diagnosis Date Noted   IDA (iron deficiency anemia) 10/17/2022   Asthma, mild intermittent 06/07/2020   Hypothyroidism 06/07/2020   Fibromyalgia 06/07/2020   Tachycardia 10/11/2017   SVT (supraventricular tachycardia) (HCC) 10/11/2017   Asthma 04/04/2009   Chronic fatigue and immune dysfunction syndrome (HCC) 01/04/1991    Past Medical History:  Diagnosis Date   Agoraphobia    Asthma    Chronic fatigue    GAD (generalized anxiety disorder)    Hypothyroidism    MDD (major depressive  disorder)    Panic attacks     Family History  Problem Relation Age of Onset   Hypertension Mother    Cancer Mother        breast   CAD Mother 65       CABG, stents   Other Father        amyloidosis   Congestive Heart Failure Sister    Heart disease Paternal Grandmother    Past Surgical History:  Procedure Laterality Date   WISDOM TOOTH EXTRACTION     Social History   Social History Narrative   Not on file    There is no immunization  history on file for this patient.   Objective: Vital Signs: BP 124/82 (BP Location: Right Arm, Patient Position: Sitting, Cuff Size: Normal)   Pulse 99   Resp 14   Ht 5' 4.25" (1.632 m)   Wt 151 lb (68.5 kg)   BMI 25.72 kg/m    Physical Exam Vitals and nursing note reviewed.  Constitutional:      Appearance: She is well-developed.  HENT:     Head: Normocephalic and atraumatic.  Eyes:     Conjunctiva/sclera: Conjunctivae normal.  Cardiovascular:     Rate and Rhythm: Normal rate and regular rhythm.     Heart sounds: Normal heart sounds.  Pulmonary:     Effort: Pulmonary effort is normal.     Breath sounds: Normal breath sounds.  Abdominal:     General: Bowel sounds are normal.     Palpations: Abdomen is soft.  Musculoskeletal:     Cervical back: Normal range of motion.  Lymphadenopathy:     Cervical: No cervical adenopathy.  Skin:    General: Skin is warm and dry.     Capillary Refill: Capillary refill takes 2 to 3 seconds.  Neurological:     Mental Status: She is alert and oriented to person, place, and time.  Psychiatric:        Behavior: Behavior normal.      Musculoskeletal Exam: Cervical, thoracic and lumbar spine 1 good range of motion.  Shoulder joints, elbow joints, wrist joints, MCPs PIPs and DIPs Juengel range of motion with no synovitis.  Hip joints and knee joints in good range of motion without any warmth swelling or effusion.  There was no tenderness over ankles or MTPs.  Bilateral dorsal spurs and first MTP thickening was noted.  She had no difficulty walking on the tiptoes or her heels.  CDAI Exam: CDAI Score: -- Patient Global: --; Provider Global: -- Swollen: --; Tender: -- Joint Exam 02/26/2024   No joint exam has been documented for this visit   There is currently no information documented on the homunculus. Go to the Rheumatology activity and complete the homunculus joint exam.  Investigation: No additional findings.  Imaging: No results  found.  Recent Labs: Lab Results  Component Value Date   WBC 9.5 02/10/2024   HGB 14.9 02/10/2024   PLT 479 (H) 02/10/2024   NA 141 10/22/2022   K 4.3 10/22/2022   CL 107 10/22/2022   CO2 29 10/22/2022   GLUCOSE 111 (H) 10/22/2022   BUN 8 10/22/2022   CREATININE 0.75 10/22/2022   BILITOT 0.4 06/26/2022   ALKPHOS 78 06/26/2022   AST 14 (L) 06/26/2022   ALT 6 06/26/2022   PROT 7.2 06/26/2022   ALBUMIN 4.3 06/26/2022   CALCIUM 8.9 10/22/2022   GFRAA  05/22/2010    >60        The  eGFR has been calculated using the MDRD equation. This calculation has not been validated in all clinical situations. eGFR's persistently <60 mL/min signify possible Chronic Kidney Disease.    Speciality Comments: No specialty comments available.  Procedures:  No procedures performed Allergies: Patient has no known allergies.   Assessment / Plan:     Visit Diagnoses: Polyarthralgia-patient complains of pain and discomfort in multiple joints over the years which has been more prominent in the last 1 year.  She complains of discomfort in her back, shoulders, wrist, hands, knees, ankles and her feet.  She has not noticed any joint swelling.  She states her hands and feet have been more painful in the last 1 year.  Pain in both hands -she complains of pain and discomfort in her bilateral hands.  She denies any history of joint stiffness.  No synovitis was noted on the examination today.  Plan: XR Hand 2 View Right, XR Hand 2 View Left.  X-rays of bilateral hands were unremarkable.  Pain in both feet -she complains of discomfort in her feet.  She states her feet even hurt in the morning.  Although she does not have any morning stiffness.  No synovitis was noted.  Bilateral mild dorsal spurring and first MTP thickening with no synovitis was noted.  Plan: XR Foot 2 Views Right, XR Foot 2 Views Left.  X-rays of bilateral feet were unremarkable.  Positive ANA (antinuclear antibody) - 08/22/23: ANA 1:40NH,  1:80NS, CRP 1.1, ESR 44, anti-CCP<16, RF<10, TSH 0.02 -her ANA was low titer positive and sedimentation rate was elevated.  She denies any history of oral ulcers, nasal ulcers, sicca symptoms, Raynaud's, rashes, lymphadenopathy or, inflammatory arthritis.  I will obtain additional labs today.  Plan: Sedimentation rate, ANA, Anti-scleroderma antibody, RNP Antibody, Anti-Smith antibody, Sjogrens syndrome-A extractable nuclear antibody, Sjogrens syndrome-B extractable nuclear antibody, Anti-DNA antibody, double-stranded, C3 and C4, Beta-2 glycoprotein antibodies, Cardiolipin antibodies, IgG, IgM, IgA  Elevated sed rate-patient states her sedimentation rate has been elevated over the last year.  I will check sed rate today.  Fibromyalgia-she was diagnosed with fibromyalgia syndrome about 20 years ago.  She states she has generalized achiness from fibromyalgia.  She has intermittent flares about once in 2 months.  No tender points were noted on the examination today.  She states her symptoms are manageable on Cymbalta 20 mg daily.  Other fatigue -she gives history of fatigue.  She denies insomnia.  Plan: COMPLETE METABOLIC PANEL WITH GFR, CK, Serum protein electrophoresis with reflex  Vitamin D deficiency -history of vitamin D deficiency.  Plan: VITAMIN D 25 Hydroxy (Vit-D Deficiency, Fractures)  Other medical problems listed as follows:  SVT (supraventricular tachycardia) (HCC)  Mild intermittent asthma, unspecified whether complicated  Hypothyroidism, unspecified type - Plan: TSH  Family history of rheumatoid arthritis-maternal aunt  Family history of MS (multiple sclerosis)-first cousin on maternal side  Orders: Orders Placed This Encounter  Procedures   XR Hand 2 View Right   XR Hand 2 View Left   XR Foot 2 Views Right   XR Foot 2 Views Left   COMPLETE METABOLIC PANEL WITH GFR   Sedimentation rate   CK   TSH   VITAMIN D 25 Hydroxy (Vit-D Deficiency, Fractures)   ANA    Anti-scleroderma antibody   RNP Antibody   Anti-Smith antibody   Sjogrens syndrome-A extractable nuclear antibody   Sjogrens syndrome-B extractable nuclear antibody   Anti-DNA antibody, double-stranded   C3 and C4   Beta-2 glycoprotein antibodies  Cardiolipin antibodies, IgG, IgM, IgA   Serum protein electrophoresis with reflex   No orders of the defined types were placed in this encounter.    Follow-Up Instructions: Return for Polyarthralgia, positive ANA.   Pollyann Savoy, MD  Note - This record has been created using Animal nutritionist.  Chart creation errors have been sought, but may not always  have been located. Such creation errors do not reflect on  the standard of medical care.

## 2024-02-26 ENCOUNTER — Ambulatory Visit (INDEPENDENT_AMBULATORY_CARE_PROVIDER_SITE_OTHER): Payer: BC Managed Care – PPO

## 2024-02-26 ENCOUNTER — Encounter: Payer: Self-pay | Admitting: Rheumatology

## 2024-02-26 ENCOUNTER — Ambulatory Visit: Payer: BC Managed Care – PPO | Attending: Rheumatology | Admitting: Rheumatology

## 2024-02-26 ENCOUNTER — Ambulatory Visit: Payer: BC Managed Care – PPO

## 2024-02-26 VITALS — BP 124/82 | HR 99 | Resp 14 | Ht 64.25 in | Wt 151.0 lb

## 2024-02-26 DIAGNOSIS — M797 Fibromyalgia: Secondary | ICD-10-CM

## 2024-02-26 DIAGNOSIS — M2550 Pain in unspecified joint: Secondary | ICD-10-CM | POA: Diagnosis not present

## 2024-02-26 DIAGNOSIS — M79671 Pain in right foot: Secondary | ICD-10-CM

## 2024-02-26 DIAGNOSIS — M79641 Pain in right hand: Secondary | ICD-10-CM | POA: Diagnosis not present

## 2024-02-26 DIAGNOSIS — E039 Hypothyroidism, unspecified: Secondary | ICD-10-CM

## 2024-02-26 DIAGNOSIS — E559 Vitamin D deficiency, unspecified: Secondary | ICD-10-CM

## 2024-02-26 DIAGNOSIS — M79672 Pain in left foot: Secondary | ICD-10-CM

## 2024-02-26 DIAGNOSIS — Z8261 Family history of arthritis: Secondary | ICD-10-CM

## 2024-02-26 DIAGNOSIS — R5383 Other fatigue: Secondary | ICD-10-CM | POA: Diagnosis not present

## 2024-02-26 DIAGNOSIS — J4520 Mild intermittent asthma, uncomplicated: Secondary | ICD-10-CM

## 2024-02-26 DIAGNOSIS — I471 Supraventricular tachycardia, unspecified: Secondary | ICD-10-CM

## 2024-02-26 DIAGNOSIS — R768 Other specified abnormal immunological findings in serum: Secondary | ICD-10-CM | POA: Diagnosis not present

## 2024-02-26 DIAGNOSIS — M79642 Pain in left hand: Secondary | ICD-10-CM

## 2024-02-26 DIAGNOSIS — R700 Elevated erythrocyte sedimentation rate: Secondary | ICD-10-CM

## 2024-02-26 DIAGNOSIS — Z820 Family history of epilepsy and other diseases of the nervous system: Secondary | ICD-10-CM

## 2024-02-26 DIAGNOSIS — R7689 Other specified abnormal immunological findings in serum: Secondary | ICD-10-CM

## 2024-02-26 DIAGNOSIS — D500 Iron deficiency anemia secondary to blood loss (chronic): Secondary | ICD-10-CM

## 2024-02-26 DIAGNOSIS — G9332 Myalgic encephalomyelitis/chronic fatigue syndrome: Secondary | ICD-10-CM

## 2024-02-26 NOTE — Patient Instructions (Addendum)
 Hand Exercises Hand exercises can be helpful for almost anyone. They can strengthen your hands and improve flexibility and movement. The exercises can also increase blood flow to the hands. These results can make your work and daily tasks easier for you. Hand exercises can be especially helpful for people who have joint pain from arthritis or nerve damage from using their hands over and over. These exercises can also help people who injure a hand. Exercises Most of these hand exercises are gentle stretching and motion exercises. It is usually safe to do them often throughout the day. Warming up your hands before exercise may help reduce stiffness. You can do this with gentle massage or by placing your hands in warm water for 10-15 minutes. It is normal to feel some stretching, pulling, tightness, or mild discomfort when you begin new exercises. In time, this will improve. Remember to always be careful and stop right away if you feel sudden, very bad pain or your pain gets worse. You want to get better and be safe. Ask your health care provider which exercises are safe for you. Do exercises exactly as told by your provider and adjust them as told. Do not begin these exercises until told by your provider. Knuckle bend or "claw" fist  Stand or sit with your arm, hand, and all five fingers pointed straight up. Make sure to keep your wrist straight. Gently bend your fingers down toward your palm until the tips of your fingers are touching your palm. Keep your big knuckle straight and only bend the small knuckles in your fingers. Hold this position for 10 seconds. Straighten your fingers back to your starting position. Repeat this exercise 5-10 times with each hand. Full finger fist  Stand or sit with your arm, hand, and all five fingers pointed straight up. Make sure to keep your wrist straight. Gently bend your fingers into your palm until the tips of your fingers are touching the middle of your  palm. Hold this position for 10 seconds. Extend your fingers back to your starting position, stretching every joint fully. Repeat this exercise 5-10 times with each hand. Straight fist  Stand or sit with your arm, hand, and all five fingers pointed straight up. Make sure to keep your wrist straight. Gently bend your fingers at the big knuckle, where your fingers meet your hand, and at the middle knuckle. Keep the knuckle at the tips of your fingers straight and try to touch the bottom of your palm. Hold this position for 10 seconds. Extend your fingers back to your starting position, stretching every joint fully. Repeat this exercise 5-10 times with each hand. Tabletop  Stand or sit with your arm, hand, and all five fingers pointed straight up. Make sure to keep your wrist straight. Gently bend your fingers at the big knuckle, where your fingers meet your hand, as far down as you can. Keep the small knuckles in your fingers straight. Think of forming a tabletop with your fingers. Hold this position for 10 seconds. Extend your fingers back to your starting position, stretching every joint fully. Repeat this exercise 5-10 times with each hand. Finger spread  Place your hand flat on a table with your palm facing down. Make sure your wrist stays straight. Spread your fingers and thumb apart from each other as far as you can until you feel a gentle stretch. Hold this position for 10 seconds. Bring your fingers and thumb tight together again. Hold this position for 10 seconds. Repeat  this exercise 5-10 times with each hand. Making circles  Stand or sit with your arm, hand, and all five fingers pointed straight up. Make sure to keep your wrist straight. Make a circle by touching the tip of your thumb to the tip of your index finger. Hold for 10 seconds. Then open your hand wide. Repeat this motion with your thumb and each of your fingers. Repeat this exercise 5-10 times with each hand. Thumb  motion  Sit with your forearm resting on a table and your wrist straight. Your thumb should be facing up toward the ceiling. Keep your fingers relaxed as you move your thumb. Lift your thumb up as high as you can toward the ceiling. Hold for 10 seconds. Bend your thumb across your palm as far as you can, reaching the tip of your thumb for the small finger (pinkie) side of your palm. Hold for 10 seconds. Repeat this exercise 5-10 times with each hand. Grip strengthening  Hold a stress ball or other soft ball in the middle of your hand. Slowly increase the pressure, squeezing the ball as much as you can without causing pain. Think of bringing the tips of your fingers into the middle of your palm. All of your finger joints should bend when doing this exercise. Hold your squeeze for 10 seconds, then relax. Repeat this exercise 5-10 times with each hand. Contact a health care provider if: Your hand pain or discomfort gets much worse when you do an exercise. Your hand pain or discomfort does not improve within 2 hours after you exercise. If you have either of these problems, stop doing these exercises right away. Do not do them again unless your provider says that you can. Get help right away if: You develop sudden, severe hand pain or swelling. If this happens, stop doing these exercises right away. Do not do them again unless your provider says that you can. This information is not intended to replace advice given to you by your health care provider. Make sure you discuss any questions you have with your health care provider. Document Revised: 01/01/2023 Document Reviewed: 01/01/2023 Elsevier Patient Education  2024 [employer] Exercises for Chronic Knee Pain Chronic knee pain is pain that lasts longer than 3 months. For most people with chronic knee pain, exercise and weight loss is an important part of treatment. Your health care provider may want you to focus on: Making the muscles that  support your knee stronger. This can take pressure off your knee and reduce pain. Preventing knee stiffness. How far you can move your knee, keeping it there or making it farther. Losing weight (if this applies) to take pressure off your knee, lower your risk for injury, and make it easier for you to exercise. Your provider will help you make an exercise program that fits your needs and physical abilities. Below are simple, low-impact exercises you can do at home. Ask your provider or physical therapist how often you should do your exercise program and how many times to repeat each exercise. General safety tips  Get your provider's approval before doing any exercises. Start slowly and stop any time you feel pain. Do not exercise if your knee pain is flaring up. Warm up first. Stretching a cold muscle can cause an injury. Do 5-10 minutes of easy movement or light stretching before beginning your exercises. Do 5-10 minutes of low-impact activity (like walking or cycling) before starting strengthening exercises. Contact your provider any time you have pain during  or after exercising. Exercise can cause discomfort but should not be painful. It is normal to be a little stiff or sore after exercising. Stretching and range-of-motion exercises Front thigh stretch  Stand up straight and support your body by holding on to a chair or resting one hand on a wall. With your legs straight and close together, bend one knee to lift your heel up toward your butt. Using one hand for support, grab your ankle with your free hand. Pull your foot up closer toward your butt to feel the stretch in front of your thigh. Hold the stretch for 30 seconds. Repeat __________ times. Complete this exercise __________ times a day. Back thigh stretch  Sit on the floor with your back straight and your legs out straight in front of you. Place the palms of your hands on the floor and slide them toward your feet as you bend at the  hip. Try to touch your nose to your knees and feel the stretch in the back of your thighs. Hold for 30 seconds. Repeat __________ times. Complete this exercise __________ times a day. Calf stretch  Stand facing a wall. Place the palms of your hands flat against the wall, arms extended, and lean slightly against the wall. Get into a lunge position with one leg bent at the knee and the other leg stretched out straight behind you. Keep both feet facing the wall and increase the bend in your knee while keeping the heel of the other leg flat on the ground. You should feel the stretch in your calf. Hold for 30 seconds. Repeat __________ times. Complete this exercise __________ times a day. Strengthening exercises Straight leg lift  Lie on your back with one knee bent and the other leg out straight. Slowly lift the straight leg without bending the knee. Lift until your foot is about 12 inches (30 cm) off the floor. Hold for 3-5 seconds and slowly lower your leg. Repeat __________ times. Complete this exercise __________ times a day. Single leg dip  Stand between two chairs and put both hands on the backs of the chairs for support. Extend one leg out straight with your body weight resting on the heel of the standing leg. Slowly bend your standing knee to dip your body to the level that is comfortable for you. Hold for 3-5 seconds. Repeat __________ times. Complete this exercise __________ times a day. Hamstring curls  Stand straight, knees close together, facing the back of a chair. Hold on to the back of a chair with both hands. Keep one leg straight. Bend the other knee while bringing the heel up toward the butt until the knee is bent at a 90-degree angle (right angle). Hold for 3-5 seconds. Repeat __________ times. Complete this exercise __________ times a day. Wall squat  Stand straight with your back, hips, and head against a wall. Step forward one foot at a time with your back  still against the wall. Your feet should be 2 feet (61 cm) from the wall at shoulder width. Keeping your back, hips, and head against the wall, slide down the wall to as close to a sitting position as you can get. Hold for 5-10 seconds, then slowly slide back up. Repeat __________ times. Complete this exercise __________ times a day. Step-ups  Stand in front of a sturdy platform or stool that is about 6 inches (15 cm) high. Slowly step up with your left / right foot, keeping your knee in line with your hip  and foot. Do not let your knee bend so far that you cannot see your toes. Hold on to a chair for balance, but do not use it for support. Slowly unlock your knee and lower yourself to the starting position. Repeat __________ times. Complete this exercise __________ times a day. Contact a health care provider if: Your exercises cause pain. Your pain is worse after you exercise. Your pain prevents you from doing your exercises. This information is not intended to replace advice given to you by your health care provider. Make sure you discuss any questions you have with your health care provider. Document Revised: 01/01/2023 Document Reviewed: 01/01/2023 Elsevier Patient Education  2024 [employer].

## 2024-02-28 LAB — SEDIMENTATION RATE: Sed Rate: 33 mm/h — ABNORMAL HIGH (ref 0–20)

## 2024-02-28 LAB — COMPLETE METABOLIC PANEL WITH GFR
AG Ratio: 1.4 (calc) (ref 1.0–2.5)
ALT: 7 U/L (ref 6–29)
AST: 14 U/L (ref 10–35)
Albumin: 4.2 g/dL (ref 3.6–5.1)
Alkaline phosphatase (APISO): 89 U/L (ref 31–125)
BUN: 9 mg/dL (ref 7–25)
CO2: 30 mmol/L (ref 20–32)
Calcium: 9.7 mg/dL (ref 8.6–10.2)
Chloride: 99 mmol/L (ref 98–110)
Creat: 0.72 mg/dL (ref 0.50–0.99)
Globulin: 3 g/dL (ref 1.9–3.7)
Glucose, Bld: 83 mg/dL (ref 65–99)
Potassium: 3.7 mmol/L (ref 3.5–5.3)
Sodium: 141 mmol/L (ref 135–146)
Total Bilirubin: 0.4 mg/dL (ref 0.2–1.2)
Total Protein: 7.2 g/dL (ref 6.1–8.1)
eGFR: 103 mL/min/{1.73_m2} (ref >=60)

## 2024-02-28 LAB — CK: Total CK: 32 U/L (ref 20–239)

## 2024-02-28 LAB — PROTEIN ELECTROPHORESIS, SERUM, WITH REFLEX
Albumin ELP: 4.1 g/dL (ref 3.8–4.8)
Alpha 1: 0.3 g/dL (ref 0.2–0.3)
Alpha 2: 0.9 g/dL (ref 0.5–0.9)
Beta 2: 0.4 g/dL (ref 0.2–0.5)
Beta Globulin: 0.4 g/dL (ref 0.4–0.6)
Gamma Globulin: 1 g/dL (ref 0.8–1.7)
Total Protein: 7.1 g/dL (ref 6.1–8.1)

## 2024-02-28 LAB — CARDIOLIPIN ANTIBODIES, IGG, IGM, IGA
Anticardiolipin IgA: <2 [APL'U]/mL (ref <20.0)
Anticardiolipin IgG: <2 [GPL'U]/mL (ref <20.0)
Anticardiolipin IgM: <2 [MPL'U]/mL (ref <20.0)

## 2024-02-28 LAB — ANA: Anti Nuclear Antibody (ANA): POSITIVE — AB

## 2024-02-28 LAB — BETA-2 GLYCOPROTEIN ANTIBODIES
Beta-2 Glyco 1 IgA: <2 U/mL (ref <20.0)
Beta-2 Glyco 1 IgM: <2 U/mL (ref <20.0)
Beta-2 Glyco I IgG: <2 U/mL (ref <20.0)

## 2024-02-28 LAB — ANTI-SCLERODERMA ANTIBODY: Scleroderma (Scl-70) (ENA) Antibody, IgG: <1 AI

## 2024-02-28 LAB — ANTI-NUCLEAR AB-TITER (ANA TITER): ANA Titer 1: 1:80 {titer} — ABNORMAL HIGH

## 2024-02-28 LAB — C3 AND C4
C3 Complement: 160 mg/dL (ref 83–193)
C4 Complement: 35 mg/dL (ref 15–57)

## 2024-02-28 LAB — SJOGRENS SYNDROME-A EXTRACTABLE NUCLEAR ANTIBODY: SSA (Ro) (ENA) Antibody, IgG: <1 AI

## 2024-02-28 LAB — RNP ANTIBODY: Ribonucleic Protein(ENA) Antibody, IgG: <1 AI

## 2024-02-28 LAB — SJOGRENS SYNDROME-B EXTRACTABLE NUCLEAR ANTIBODY: SSB (La) (ENA) Antibody, IgG: <1 AI

## 2024-02-28 LAB — ANTI-SMITH ANTIBODY: ENA SM Ab Ser-aCnc: <1 AI

## 2024-02-28 LAB — VITAMIN D 25 HYDROXY (VIT D DEFICIENCY, FRACTURES): Vit D, 25-Hydroxy: 68 ng/mL (ref 30–100)

## 2024-02-28 LAB — ANTI-DNA ANTIBODY, DOUBLE-STRANDED: ds DNA Ab: <1 [IU]/mL

## 2024-02-28 LAB — TSH: TSH: 0.11 m[IU]/L — ABNORMAL LOW

## 2024-03-01 NOTE — Progress Notes (Signed)
 Sed rate elevated and stable, CK normal,TSH is low, ANA low titer positive, ENA panel negative,b2 GP1 negative, aCL negative, Vit D normal, SPEP normal. I will discuss results at the follow up visit.

## 2024-03-04 ENCOUNTER — Telehealth: Admitting: Family Medicine

## 2024-03-04 DIAGNOSIS — B9789 Other viral agents as the cause of diseases classified elsewhere: Secondary | ICD-10-CM

## 2024-03-04 DIAGNOSIS — J019 Acute sinusitis, unspecified: Secondary | ICD-10-CM

## 2024-03-04 NOTE — Progress Notes (Signed)
 Office Visit Note  Patient: Mallory Collins             Date of Birth: September 11, 1976           MRN: [id number]             PCP: Kristian Covey, MD Referring: Kristian Covey, MD Visit Date: 03/18/2024 Occupation: @GUAROCC @  Subjective:  Pain in multiple joints  History of Present Illness: Shristi Scheib is a 48 y.o. female with polyarthralgia and positive ANA.  She returns today after her last visit on February 26.  She states she continues to have morning stiffness and pain in her hands and her feet.  She has not noticed any joint swelling.  She also has discomfort in her entire back and sometimes in her knees.  She has not noticed any joint swelling.  There is no history of oral ulcers, nasal ulcers, sicca symptoms, malar rash, photosensitivity, Raynaud's or lymphadenopathy.  She has noticed some improvement on Cymbalta.    Activities of Daily Living:  Patient reports morning stiffness for 3 hours.   Patient Denies nocturnal pain.  Difficulty dressing/grooming: Denies Difficulty climbing stairs: Denies Difficulty getting out of chair: Denies Difficulty using hands for taps, buttons, cutlery, and/or writing: Reports  Review of Systems  Constitutional:  Positive for fatigue.  HENT:  Negative for mouth sores and mouth dryness.   Eyes:  Negative for dryness.  Respiratory:  Negative for shortness of breath.   Cardiovascular:  Negative for chest pain and palpitations.  Gastrointestinal:  Negative for blood in stool, constipation and diarrhea.  Endocrine: Negative for increased urination.  Genitourinary:  Negative for involuntary urination.  Musculoskeletal:  Positive for joint pain, joint pain, myalgias, morning stiffness, muscle tenderness and myalgias. Negative for gait problem, joint swelling and muscle weakness.  Skin:  Negative for color change, rash and sensitivity to sunlight.  Allergic/Immunologic: Positive for susceptible to infections.  Neurological:   Negative for dizziness and headaches.  Hematological:  Negative for swollen glands.  Psychiatric/Behavioral:  Negative for depressed mood and sleep disturbance. The patient is not nervous/anxious.     PMFS History:  Patient Active Problem List   Diagnosis Date Noted   IDA (iron deficiency anemia) 10/17/2022   Asthma, mild intermittent 06/07/2020   Hypothyroidism 06/07/2020   Fibromyalgia 06/07/2020   Tachycardia 10/11/2017   SVT (supraventricular tachycardia) (HCC) 10/11/2017   Asthma 04/04/2009   Chronic fatigue and immune dysfunction syndrome (HCC) 01/04/1991    Past Medical History:  Diagnosis Date   Agoraphobia    Asthma    Chronic fatigue    GAD (generalized anxiety disorder)    Hypothyroidism    MDD (major depressive disorder)    Panic attacks     Family History  Problem Relation Age of Onset   Hypertension Mother    Cancer Mother        breast   CAD Mother 31       CABG, stents   Other Father        amyloidosis   Congestive Heart Failure Sister    Heart disease Paternal Grandmother    Past Surgical History:  Procedure Laterality Date   WISDOM TOOTH EXTRACTION     Social History   Social History Narrative   Not on file    There is no immunization history on file for this patient.   Objective: Vital Signs: BP 128/88 (BP Location: Right Arm, Patient Position: Sitting, Cuff Size: Normal)   Pulse 98  Resp 15   Ht 5\' 4"  (1.626 m)   Wt 150 lb (68 kg)   BMI 25.75 kg/m    Physical Exam Vitals and nursing note reviewed.  Constitutional:      Appearance: She is well-developed.  HENT:     Head: Normocephalic and atraumatic.  Eyes:     Conjunctiva/sclera: Conjunctivae normal.  Cardiovascular:     Rate and Rhythm: Normal rate and regular rhythm.     Heart sounds: Normal heart sounds.  Pulmonary:     Effort: Pulmonary effort is normal.     Breath sounds: Normal breath sounds.  Abdominal:     General: Bowel sounds are normal.     Palpations: Abdomen  is soft.  Musculoskeletal:     Cervical back: Normal range of motion.  Lymphadenopathy:     Cervical: No cervical adenopathy.  Skin:    General: Skin is warm and dry.     Capillary Refill: Capillary refill takes less than 2 seconds.  Neurological:     Mental Status: She is alert and oriented to person, place, and time.  Psychiatric:        Behavior: Behavior normal.      Musculoskeletal Exam: Cervical, thoracic and lumbar spine 1 good range of motion.  Shoulder joints, elbow joints, wrist joints, MCPs PIPs and DIPs were in good range of motion with no synovitis.  Hip joints and knee joints were in good range of motion with no warmth swelling or effusion.  There was no tenderness over ankles or MTPs.  She had hypermobility in most of her joints.  CDAI Exam: CDAI Score: -- Patient Global: --; Provider Global: -- Swollen: --; Tender: -- Joint Exam 03/18/2024   No joint exam has been documented for this visit   There is currently no information documented on the homunculus. Go to the Rheumatology activity and complete the homunculus joint exam.  Investigation: No additional findings.  Imaging: XR Foot 2 Views Left Result Date: 02/26/2024 No MTP, PIP, DIP, intertarsal, tibiotalar or subtalar joint space narrowing was noted.  No erosive changes were noted. Impression: Unremarkable x-rays of the foot.  XR Foot 2 Views Right Result Date: 02/26/2024 No MTP, PIP, DIP, intertarsal, tibiotalar or subtalar joint space narrowing was noted.  No erosive changes were noted. Impression: Unremarkable x-rays of the foot.  XR Hand 2 View Left Result Date: 02/26/2024 No CMC, MCP, PIP, DIP, intercarpal or radiocarpal joint space narrowing was noted.  No erosive changes were noted. Impression: Unremarkable x-rays of the hand.  XR Hand 2 View Right Result Date: 02/26/2024 No CMC, MCP, PIP, DIP, intercarpal or radiocarpal joint space narrowing was noted.  No erosive changes were noted. Impression:  Unremarkable x-rays of the hand.   Recent Labs: Lab Results  Component Value Date   WBC 9.5 02/10/2024   HGB 14.9 02/10/2024   PLT 479 (H) 02/10/2024   NA 141 02/26/2024   K 3.7 02/26/2024   CL 99 02/26/2024   CO2 30 02/26/2024   GLUCOSE 83 02/26/2024   BUN 9 02/26/2024   CREATININE 0.72 02/26/2024   BILITOT 0.4 02/26/2024   ALKPHOS 78 06/26/2022   AST 14 02/26/2024   ALT 7 02/26/2024   PROT 7.2 02/26/2024   PROT 7.1 02/26/2024   ALBUMIN 4.3 06/26/2022   CALCIUM 9.7 02/26/2024   GFRAA  05/22/2010    >60        The eGFR has been calculated using the MDRD equation. This calculation has not been validated  in all clinical situations. eGFR's persistently <60 mL/min signify possible Chronic Kidney Disease.   February 26, 2024 SPEP normal, ANA 1: 80 NH, ENA (SCL 70, RNP, Smith, SSA, SSB, dsDNA) negative, C3-C4 normal, anticardiolipin negative, beta-2 GP 1 negative, ESR 33, CK 32, TSH 0.11, vitamin D 68  Speciality Comments: No specialty comments available.  Procedures:  No procedures performed Allergies: Patient has no known allergies.   Assessment / Plan:     Visit Diagnoses: Polyarthralgia - History of pain in multiple joints for many years.  History of discomfort in back, shoulders, wrists, hands, knees, ankles and feet.  No synovitis was noted on the examination.  I did detailed discussion with the patient that her symptoms could be related to fibromyalgia syndrome.  Pain in both hands -she complains of discomfort in the bilateral hands.  She has hypermobility in joints.  No synovitis was noted.  X-rays obtained at the last visit of bilateral hands were unremarkable.  X-ray findings were reviewed with the patient.  Pain in both feet -she has ongoing discomfort in her feet.  X-rays obtained of bilateral feet were unremarkable.  Bilateral dorsal spurs and bilateral first MTP thickening was noted.  X-ray findings were reviewed with the patient.  Proper fitting shoes were  advised.  Positive ANA (antinuclear antibody) - ANA low titer positive, ENA negative, complements normal, beta-2 GP 1 negative, anticardiolipin negative. -Lab results were reviewed with the patient at length.  Association of ANA with autoimmune disease and also is presents in normal population was discussed.  She denies any history of oral ulcers, nasal ulcers, sicca symptoms, malar rash, photosensitivity, inflammatory arthritis or lymphadenopathy.  I advised her to contact me if she develops any symptoms.  Otherwise I will repeat her labs in a year prior to her next visit.  Plan: Protein / creatinine ratio, urine, CBC with Differential/Platelet, COMPLETE METABOLIC PANEL WITH GFR, Anti-DNA antibody, double-stranded, C3 and C4, ANA, Sedimentation rate  Fibromyalgia-she complains of generalized pain, arthralgias, myalgias, positive tender points.  She has intermittent flares.  She has been feeling better since she has been on Cymbalta.  She may benefit from physical therapy.  I will refer her to physical therapy.  Benefits of water aerobics, swimming, stretching were discussed.  Hypermobility of joint-she has hypermobility in almost all of her joints.  Joint protection muscle strengthening and isometric exercises were discussed.  Elevated sed rate - Sed rate remains mildly elevated at 33.  Other fatigue-most likely related to fibromyalgia.  Vitamin D deficiency-vitamin D was normal at 68 on February 26, 2024.  Other medical problems listed as follows:  SVT (supraventricular tachycardia) (HCC)  Hypothyroidism, unspecified type  Mild intermittent asthma, unspecified whether complicated  Family history of rheumatoid arthritis-maternal aunt  Family history of MS (multiple sclerosis)-first cousin on maternal side  Orders: Orders Placed This Encounter  Procedures   Protein / creatinine ratio, urine   CBC with Differential/Platelet   COMPLETE METABOLIC PANEL WITH GFR   Anti-DNA antibody,  double-stranded   C3 and C4   ANA   Sedimentation rate   No orders of the defined types were placed in this encounter.    Follow-Up Instructions: Return in about 1 year (around 03/18/2025) for Osteoarthritis,+ANA.   Pollyann Savoy, MD  Note - This record has been created using Animal nutritionist.  Chart creation errors have been sought, but may not always  have been located. Such creation errors do not reflect on  the standard of medical care.

## 2024-03-05 NOTE — Progress Notes (Signed)
 E-Visit for Sinus Problems  We are sorry that you are not feeling well.  Here is how we plan to help!  Based on what you have shared with me it looks like you have sinusitis.  Sinusitis is inflammation and infection in the sinus cavities of the head.  Based on your presentation I believe you most likely have Acute Viral Sinusitis.This is an infection most likely caused by a virus. There is not specific treatment for viral sinusitis other than to help you with the symptoms until the infection runs its course.  You may use an oral decongestant such as Mucinex D or if you have glaucoma or high blood pressure use plain Mucinex. Saline nasal spray help and can safely be used as often as needed for congestion, I have prescribed: Fluticasone nasal spray two sprays in each nostril once a day  Some authorities believe that zinc sprays or the use of Echinacea may shorten the course of your symptoms.  Sinus infections are not as easily transmitted as other respiratory infection, however we still recommend that you avoid close contact with loved ones, especially the very young and elderly.  Remember to wash your hands thoroughly throughout the day as this is the number one way to prevent the spread of infection!  Home Care: Only take medications as instructed by your medical team. Do not take these medications with alcohol. A steam or ultrasonic humidifier can help congestion.  You can place a towel over your head and breathe in the steam from hot water coming from a faucet. Avoid close contacts especially the very young and the elderly. Cover your mouth when you cough or sneeze. Always remember to wash your hands.  Get Help Right Away If: You develop worsening fever or sinus pain. You develop a severe head ache or visual changes. Your symptoms persist after you have completed your treatment plan.  Make sure you Understand these instructions. Will watch your condition. Will get help right away if you  are not doing well or get worse.   Thank you for choosing an e-visit.  Your e-visit answers were reviewed by a board certified advanced clinical practitioner to complete your personal care plan. Depending upon the condition, your plan could have included both over the counter or prescription medications.  Please review your pharmacy choice. Make sure the pharmacy is open so you can pick up prescription now. If there is a problem, you may contact your provider through [employer] and have the prescription routed to another pharmacy.  Your safety is important to [place]. If you have drug allergies check your prescription carefully.   For the next 24 hours you can use MyChart to ask questions about today's visit, request a non-urgent call back, or ask for a work or school excuse. You will get an email in the next two days asking about your experience. I hope that your e-visit has been valuable and will speed your recovery.   have provided 5 minutes of non face to face time during this encounter for chart review and documentation.

## 2024-03-18 ENCOUNTER — Encounter: Payer: Self-pay | Admitting: Rheumatology

## 2024-03-18 ENCOUNTER — Ambulatory Visit: Payer: 59 | Attending: Rheumatology | Admitting: Rheumatology

## 2024-03-18 VITALS — BP 128/88 | HR 98 | Resp 15 | Ht 64.0 in | Wt 150.0 lb

## 2024-03-18 DIAGNOSIS — M79672 Pain in left foot: Secondary | ICD-10-CM

## 2024-03-18 DIAGNOSIS — M79671 Pain in right foot: Secondary | ICD-10-CM | POA: Diagnosis not present

## 2024-03-18 DIAGNOSIS — M249 Joint derangement, unspecified: Secondary | ICD-10-CM

## 2024-03-18 DIAGNOSIS — R768 Other specified abnormal immunological findings in serum: Secondary | ICD-10-CM | POA: Diagnosis not present

## 2024-03-18 DIAGNOSIS — I471 Supraventricular tachycardia, unspecified: Secondary | ICD-10-CM

## 2024-03-18 DIAGNOSIS — M79641 Pain in right hand: Secondary | ICD-10-CM | POA: Diagnosis not present

## 2024-03-18 DIAGNOSIS — Z820 Family history of epilepsy and other diseases of the nervous system: Secondary | ICD-10-CM

## 2024-03-18 DIAGNOSIS — M2550 Pain in unspecified joint: Secondary | ICD-10-CM

## 2024-03-18 DIAGNOSIS — R700 Elevated erythrocyte sedimentation rate: Secondary | ICD-10-CM

## 2024-03-18 DIAGNOSIS — E559 Vitamin D deficiency, unspecified: Secondary | ICD-10-CM

## 2024-03-18 DIAGNOSIS — Z8261 Family history of arthritis: Secondary | ICD-10-CM

## 2024-03-18 DIAGNOSIS — M797 Fibromyalgia: Secondary | ICD-10-CM

## 2024-03-18 DIAGNOSIS — J4520 Mild intermittent asthma, uncomplicated: Secondary | ICD-10-CM

## 2024-03-18 DIAGNOSIS — M79642 Pain in left hand: Secondary | ICD-10-CM

## 2024-03-18 DIAGNOSIS — E039 Hypothyroidism, unspecified: Secondary | ICD-10-CM

## 2024-03-18 DIAGNOSIS — R5383 Other fatigue: Secondary | ICD-10-CM

## 2024-04-12 ENCOUNTER — Other Ambulatory Visit: Payer: Self-pay | Admitting: Adult Health

## 2024-04-12 DIAGNOSIS — F331 Major depressive disorder, recurrent, moderate: Secondary | ICD-10-CM

## 2024-04-12 DIAGNOSIS — F411 Generalized anxiety disorder: Secondary | ICD-10-CM

## 2024-05-17 ENCOUNTER — Telehealth: Admitting: Physician Assistant

## 2024-05-17 DIAGNOSIS — M545 Low back pain, unspecified: Secondary | ICD-10-CM | POA: Diagnosis not present

## 2024-05-17 MED ORDER — NAPROXEN 500 MG PO TABS: 500.0000 mg | ORAL_TABLET | Freq: Two times a day (BID) | ORAL | 0 refills | Status: AC

## 2024-05-17 MED ORDER — CYCLOBENZAPRINE HCL 10 MG PO TABS: 10.0000 mg | ORAL_TABLET | Freq: Three times a day (TID) | ORAL | 0 refills | Status: AC | PRN

## 2024-05-17 NOTE — Progress Notes (Signed)
 I have spent 5 minutes in review of e-visit questionnaire, review and updating patient chart, medical decision making and response to patient.   Laure Kidney, PA-C

## 2024-05-17 NOTE — Progress Notes (Signed)

## 2024-06-04 DIAGNOSIS — Z6825 Body mass index (BMI) 25.0-25.9, adult: Secondary | ICD-10-CM | POA: Diagnosis not present

## 2024-06-04 DIAGNOSIS — M533 Sacrococcygeal disorders, not elsewhere classified: Secondary | ICD-10-CM | POA: Diagnosis not present

## 2024-06-04 DIAGNOSIS — G8929 Other chronic pain: Secondary | ICD-10-CM | POA: Diagnosis not present

## 2024-06-05 ENCOUNTER — Other Ambulatory Visit: Payer: Self-pay | Admitting: Family

## 2024-06-05 DIAGNOSIS — D500 Iron deficiency anemia secondary to blood loss (chronic): Secondary | ICD-10-CM

## 2024-06-08 ENCOUNTER — Inpatient Hospital Stay: Payer: BC Managed Care – PPO | Attending: Family Medicine

## 2024-06-08 ENCOUNTER — Inpatient Hospital Stay: Payer: BC Managed Care – PPO

## 2024-06-08 ENCOUNTER — Inpatient Hospital Stay: Payer: BC Managed Care – PPO | Admitting: Family

## 2024-06-23 ENCOUNTER — Encounter: Payer: Self-pay | Admitting: Family

## 2024-06-29 ENCOUNTER — Encounter: Payer: Self-pay | Admitting: Family

## 2024-06-30 ENCOUNTER — Ambulatory Visit (INDEPENDENT_AMBULATORY_CARE_PROVIDER_SITE_OTHER): Admitting: Adult Health

## 2024-06-30 ENCOUNTER — Encounter: Payer: Self-pay | Admitting: Adult Health

## 2024-06-30 DIAGNOSIS — F331 Major depressive disorder, recurrent, moderate: Secondary | ICD-10-CM | POA: Diagnosis not present

## 2024-06-30 DIAGNOSIS — F410 Panic disorder [episodic paroxysmal anxiety] without agoraphobia: Secondary | ICD-10-CM

## 2024-06-30 DIAGNOSIS — M533 Sacrococcygeal disorders, not elsewhere classified: Secondary | ICD-10-CM | POA: Diagnosis not present

## 2024-06-30 DIAGNOSIS — F4000 Agoraphobia, unspecified: Secondary | ICD-10-CM | POA: Diagnosis not present

## 2024-06-30 DIAGNOSIS — F411 Generalized anxiety disorder: Secondary | ICD-10-CM | POA: Diagnosis not present

## 2024-06-30 MED ORDER — LORAZEPAM 0.5 MG PO TABS: 0.5000 mg | ORAL_TABLET | Freq: Two times a day (BID) | ORAL | 2 refills | Status: DC

## 2024-06-30 MED ORDER — DULOXETINE HCL 30 MG PO CPEP: 30.0000 mg | ORAL_CAPSULE | Freq: Every day | ORAL | 5 refills | Status: DC

## 2024-06-30 NOTE — Progress Notes (Signed)
 Mallory Collins [id number] 1976/12/25 48 y.o.  Virtual Visit via Telephone Note  I connected with pt on 06/30/24 at  4:30 PM EDT by telephone and verified that I am speaking with the correct person using two identifiers.   I discussed the limitations, risks, security and privacy concerns of performing an evaluation and management service by telephone and the availability of in person appointments. I also discussed with the patient that there may be a patient responsible charge related to this service. The patient expressed understanding and agreed to proceed.   I discussed the assessment and treatment plan with the patient. The patient was provided an opportunity to ask questions and all were answered. The patient agreed with the plan and demonstrated an understanding of the instructions.   The patient was advised to call back or seek an in-person evaluation if the symptoms worsen or if the condition fails to improve as anticipated.  I provided 25 minutes of non-face-to-face time during this encounter.  The patient was located at home.  The provider was located at [facility].   Mallory LOISE Sayers, NP   Subjective:   Patient ID:  Mallory Collins is a 48 y.o. (DOB Feb 23, 1976) female.  Chief Complaint: No chief complaint on file.   HPI Mallory Collins presents for follow-up of GAD, MDD, panic attacks, and agoraphobia.   Describes mood today as ok. Denies tearfulness. Mood symptoms - denies depression and irritability. Reports varying interest and motivation. Reports some situational anxiety. Denies recent panic attacks. Denies worry, rumination and over thinking. Reports mood is stable. Stating I feel like I'm doing alright. Feels like the Ativan  continues to work well when needed. Taking medications as prescribed.  Energy levels vary. Active, does not have a regular exercise routine.  Enjoys some usual interests and activities. Married. Lives with husband and  mother - 2 dogs. Spending time with family. Appetite adequate. Weight loss - 155 to 162 pounds.  Sleeps well most nights. Averages 9 - 10 hours. Focus and concentration stable. Completing tasks. Managing aspects of household. Unable to work - Fibromyalgia. Denies SI or HI.  Denies AH or VH. Denies self harm. Denies substance use.  Previous medication trials: Clonazepam - too sedating, Zoloft    Review of Systems:  Review of Systems  Musculoskeletal:  Negative for gait problem.  Neurological:  Negative for tremors.  Psychiatric/Behavioral:         Please refer to HPI    Medications: I have reviewed the patient's current medications.  Current Outpatient Medications  Medication Sig Dispense Refill   albuterol  (VENTOLIN  HFA) 108 (90 Base) MCG/ACT inhaler TAKE 2 PUFFS BY MOUTH EVERY 6 HOURS AS NEEDED FOR WHEEZE OR SHORTNESS OF BREATH 8.5 each 1   cholecalciferol (VITAMIN D ) 1000 units tablet Take 1,000 Units by mouth daily.     DULoxetine  (CYMBALTA ) 30 MG capsule Take 1 capsule (30 mg total) by mouth daily. 60 capsule 5   ferrous gluconate  (FERGON) 324 MG tablet TAKE 1 TABLET BY MOUTH DAILY WITH BREAKFAST 90 tablet 1   levothyroxine  (SYNTHROID ) 137 MCG tablet Take 1 tablet (137 mcg total) by mouth daily before breakfast. 90 tablet 0   LORazepam  (ATIVAN ) 0.5 MG tablet Take 1 tablet (0.5 mg total) by mouth 2 (two) times daily. 60 tablet 2   Multiple Vitamin (MULTIVITAMIN) tablet Take 1 tablet by mouth daily.     ondansetron  (ZOFRAN ) 4 MG tablet TAKE 1 TABLET BY MOUTH EVERY 8 HOURS AS NEEDED FOR NAUSEA AND VOMITING  9 tablet 1   propranolol  (INDERAL ) 10 MG tablet TAKE 1 TABLET BY MOUTH EVERY DAY AS NEEDED 90 tablet 0   No current facility-administered medications for this visit.    Medication Side Effects: None  Allergies: No Known Allergies  Past Medical History:  Diagnosis Date   Agoraphobia    Asthma    Chronic fatigue    GAD (generalized anxiety disorder)    Hypothyroidism     MDD (major depressive disorder)    Panic attacks     Family History  Problem Relation Age of Onset   Hypertension Mother    Cancer Mother        breast   CAD Mother 6       CABG, stents   Other Father        amyloidosis   Congestive Heart Failure Sister    Heart disease Paternal Grandmother     Social History   Socioeconomic History   Marital status: Married    Spouse name: Not on file   Number of children: Not on file   Years of education: Not on file   Highest education level: Some college, no degree  Occupational History   Not on file  Tobacco Use   Smoking status: Never    Passive exposure: Never   Smokeless tobacco: Never  Vaping Use   Vaping status: Never Used  Substance and Sexual Activity   Alcohol use: Never   Drug use: Never   Sexual activity: Not on file  Other Topics Concern   Not on file  Social History Narrative   Not on file   Social Drivers of Health   Financial Resource Strain: Patient Declined (11/25/2022)   Overall Financial Resource Strain (CARDIA)    Difficulty of Paying Living Expenses: Patient declined  Food Insecurity: Patient Declined (11/25/2022)   Hunger Vital Sign    Worried About Running Out of Food in the Last Year: Patient declined    Ran Out of Food in the Last Year: Patient declined  Transportation Needs: No Transportation Needs (11/25/2022)   PRAPARE - Administrator, Civil Service (Medical): No    Lack of Transportation (Non-Medical): No  Physical Activity: Unknown (11/25/2022)   Exercise Vital Sign    Days of Exercise per Week: 0 days    Minutes of Exercise per Session: Not on file  Stress: No Stress Concern Present (11/25/2022)   [employer] of Occupational Health - Occupational Stress Questionnaire    Feeling of Stress : Only a little  Social Connections: Unknown (11/25/2022)   Social Connection and Isolation Panel    Frequency of Communication with Friends and Family: Patient declined     Frequency of Social Gatherings with Friends and Family: Patient declined    Attends Religious Services: Patient declined    Database administrator or Organizations: Patient declined    Attends Engineer, structural: Not on file    Marital Status: Married  Catering manager Violence: Not on file    Past Medical History, Surgical history, Social history, and Family history were reviewed and updated as appropriate.   Please see review of systems for further details on the patient's review from today.   Objective:   Physical Exam:  There were no vitals taken for this visit.  Physical Exam Constitutional:      General: She is not in acute distress.  Musculoskeletal:        General: No deformity.   Neurological:  Mental Status: She is alert and oriented to person, place, and time.     Coordination: Coordination normal.   Psychiatric:        Attention and Perception: Attention and perception normal. She does not perceive auditory or visual hallucinations.        Mood and Affect: Mood normal. Mood is not anxious or depressed. Affect is not labile, blunt, angry or inappropriate.        Speech: Speech normal.        Behavior: Behavior normal.        Thought Content: Thought content normal. Thought content is not paranoid or delusional. Thought content does not include homicidal or suicidal ideation. Thought content does not include homicidal or suicidal plan.        Cognition and Memory: Cognition and memory normal.        Judgment: Judgment normal.     Comments: Insight intact     Lab Review:     Component Value Date/Time   NA 141 02/26/2024 1106   K 3.7 02/26/2024 1106   CL 99 02/26/2024 1106   CO2 30 02/26/2024 1106   GLUCOSE 83 02/26/2024 1106   BUN 9 02/26/2024 1106   CREATININE 0.72 02/26/2024 1106   CALCIUM 9.7 02/26/2024 1106   PROT 7.2 02/26/2024 1106   PROT 7.1 02/26/2024 1106   ALBUMIN 4.3 06/26/2022 1420   AST 14 02/26/2024 1106   AST 14 (L)  06/26/2022 1420   ALT 7 02/26/2024 1106   ALT 6 06/26/2022 1420   ALKPHOS 78 06/26/2022 1420   BILITOT 0.4 02/26/2024 1106   BILITOT 0.4 06/26/2022 1420   GFRNONAA >60 10/22/2022 1337   GFRNONAA >60 06/26/2022 1420   GFRAA  05/22/2010 0100    >60        The eGFR has been calculated using the MDRD equation. This calculation has not been validated in all clinical situations. eGFR's persistently <60 mL/min signify possible Chronic Kidney Disease.       Component Value Date/Time   WBC 9.5 02/10/2024 1104   WBC 9.2 10/22/2022 1337   RBC 5.10 02/10/2024 1104   RBC 5.09 02/10/2024 1104   HGB 14.9 02/10/2024 1104   HCT 44.5 02/10/2024 1104   PLT 479 (H) 02/10/2024 1104   MCV 87.4 02/10/2024 1104   MCH 29.3 02/10/2024 1104   MCHC 33.5 02/10/2024 1104   RDW 13.1 02/10/2024 1104   LYMPHSABS 2.2 02/10/2024 1104   MONOABS 0.7 02/10/2024 1104   EOSABS 0.0 02/10/2024 1104   BASOSABS 0.0 02/10/2024 1104    No results found for: POCLITH, LITHIUM   No results found for: PHENYTOIN, PHENOBARB, VALPROATE, CBMZ   .res Assessment: Plan:    Plan:  PDMP reviewed  Ativan  0.5mg  daily as needed for anxiety/agoraphobia Cymbalta  30mg  BID   RTC 6 months - will call in 3 months for next set of refills.   Patient advised to contact office with any questions, adverse effects, or acute worsening in signs and symptoms.  Discussed potential benefits, risk, and side effects of benzodiazepines to include potential risk of tolerance and dependence, as well as possible drowsiness.  Advised patient not to drive if experiencing drowsiness and to take lowest possible effective dose to minimize risk of dependence and tolerance.  Diagnoses and all orders for this visit:  Major depressive disorder, recurrent episode, moderate (HCC) -     DULoxetine  (CYMBALTA ) 30 MG capsule; Take 1 capsule (30 mg total) by mouth daily.  Generalized anxiety disorder -  LORazepam  (ATIVAN ) 0.5 MG tablet;  Take 1 tablet (0.5 mg total) by mouth 2 (two) times daily. -     DULoxetine  (CYMBALTA ) 30 MG capsule; Take 1 capsule (30 mg total) by mouth daily.  Panic attacks -     LORazepam  (ATIVAN ) 0.5 MG tablet; Take 1 tablet (0.5 mg total) by mouth 2 (two) times daily.  Agoraphobia    Please see After Visit Summary for patient specific instructions.  Future Appointments  Date Time Provider Department Center  09/30/2024  2:00 PM Katharyn Schauer Nattalie, NP [facility] None  03/17/2025  1:00 PM Deveshwar, Maya, MD [facility] None    No orders of the defined types were placed in this encounter.     -------------------------------

## 2024-07-27 ENCOUNTER — Other Ambulatory Visit: Payer: Self-pay | Admitting: Adult Health

## 2024-07-27 DIAGNOSIS — F331 Major depressive disorder, recurrent, moderate: Secondary | ICD-10-CM

## 2024-07-27 DIAGNOSIS — F411 Generalized anxiety disorder: Secondary | ICD-10-CM

## 2024-08-13 DIAGNOSIS — H0279 Other degenerative disorders of eyelid and periocular area: Secondary | ICD-10-CM | POA: Diagnosis not present

## 2024-08-13 DIAGNOSIS — H02421 Myogenic ptosis of right eyelid: Secondary | ICD-10-CM | POA: Diagnosis not present

## 2024-08-15 ENCOUNTER — Other Ambulatory Visit: Payer: Self-pay | Admitting: Family Medicine

## 2024-08-25 DIAGNOSIS — H02421 Myogenic ptosis of right eyelid: Secondary | ICD-10-CM | POA: Diagnosis not present

## 2024-09-22 NOTE — Progress Notes (Deleted)
 Initial neurology clinic note  Reason for Evaluation: Consultation requested by Odella Refugio PARAS, MD for an opinion regarding ptosis. My final recommendations will be communicated back to the requesting physician by way of shared medical record or letter to requesting physician via [place]  mail.  HPI: This is [name], a 48 y.o. ***-handed female with a medical history of hypothyroidism, anxiety*** who presents to neurology clinic with the chief complaint of ***. The patient is accompanied by ***.  *** Seen by Dr. 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 ***  Muscle bulk loss? *** Muscle pain? ***  Cramps/Twitching? *** Suggestion of myotonia/difficulty relaxing after contraction? ***  Fatigable weakness?*** Does strength improve after brief exercise?***  Able to brush hair/teeth without difficulty? *** Able to button shirts/use zips? *** Clumsiness/dropping grasped objects?*** Can you arise from squatted position easily? *** Able to get out of chair without using arms? *** Able to walk up steps easily? *** Use an assistive device to walk? *** Significant imbalance with walking? *** Falls?*** Any change in urine color, especially after exertion/physical activity? ***  The patient denies*** symptoms suggestive of oculobulbar weakness including diplopia, ptosis, dysphagia, poor saliva control, dysarthria/dysphonia, impaired mastication, facial weakness/droop.  There are no*** neuromuscular respiratory weakness symptoms,  particularly orthopnea>dyspnea.   Pseudobulbar affect is absent***.  The patient does not*** report symptoms referable to autonomic dysfunction including impaired sweating, heat or cold intolerance, excessive mucosal dryness, gastroparetic early satiety, postprandial abdominal bloating, constipation, bowel or bladder dyscontrol, erectile dysfunction*** or syncope/presyncope/orthostatic intolerance.  There are no*** complaints relating to other symptoms of small fiber modalities including paresthesia/pain.  The patient has not *** noticed any recent skin rashes nor does he*** report any constitutional symptoms like fever, night sweats, anorexia or unintentional weight loss.  EtOH use: ***  Restrictive diet? *** Family history of neuropathy/myopathy/NM disease?***  Previous labs, electrodiagnostics, and neuroimaging are summarized below, but pertinent findings include***  Any biopsy done? *** Current medications being tried for the patient's symptoms include ***  Prior medications that have been tried: ***   MEDICATIONS:  Outpatient Encounter Medications as of 09/23/2024  Medication Sig   albuterol  (VENTOLIN  HFA) 108 (90 Base) MCG/ACT inhaler TAKE 2 PUFFS BY MOUTH EVERY 6 HOURS AS NEEDED FOR WHEEZE OR SHORTNESS OF BREATH   cholecalciferol (VITAMIN D ) 1000 units tablet Take 1,000 Units by mouth daily.   DULoxetine  (CYMBALTA ) 30 MG capsule TAKE 1 CAPSULE BY MOUTH EVERY DAY   ferrous gluconate  (FERGON) 324 MG tablet TAKE 1 TABLET BY MOUTH DAILY WITH BREAKFAST   levothyroxine  (SYNTHROID ) 137 MCG tablet Take 1 tablet (137 mcg total) by mouth daily before breakfast.   LORazepam  (ATIVAN ) 0.5 MG tablet Take 1 tablet (0.5 mg total) by mouth 2 (two) times daily.   Multiple Vitamin (MULTIVITAMIN) tablet Take 1 tablet by mouth daily.   ondansetron  (ZOFRAN ) 4 MG tablet TAKE 1 TABLET BY MOUTH EVERY 8 HOURS AS NEEDED FOR NAUSEA AND VOMITING   propranolol  (INDERAL ) 10 MG tablet TAKE 1 TABLET BY MOUTH  EVERY DAY AS NEEDED  No facility-administered encounter medications on file as of 09/23/2024.    PAST MEDICAL HISTORY: Past Medical History:  Diagnosis Date   Agoraphobia    Asthma    Chronic fatigue    GAD (generalized anxiety disorder)    Hypothyroidism    MDD (major depressive disorder)    Panic attacks     PAST SURGICAL HISTORY: Past Surgical History:  Procedure Laterality Date   WISDOM TOOTH EXTRACTION      ALLERGIES: No Known Allergies  FAMILY HISTORY: Family History  Problem Relation Age of Onset   Hypertension Mother    Cancer Mother        breast   CAD Mother 35       CABG, stents   Other Father        amyloidosis   Congestive Heart Failure Sister    Heart disease Paternal Grandmother     SOCIAL HISTORY: Social History   Tobacco Use   Smoking status: Never    Passive exposure: Never   Smokeless tobacco: Never  Vaping Use   Vaping status: Never Used  Substance Use Topics   Alcohol use: Never   Drug use: Never   Social History   Social History Narrative   Not on file     OBJECTIVE: PHYSICAL EXAM: There were no vitals taken for this visit.  General:*** General appearance: Awake and alert. No distress. Cooperative with exam.  Skin: No obvious rash or jaundice. HEENT: Atraumatic. Anicteric. Lungs: Non-labored breathing on room air  Heart: Regular Abdomen: Soft, non tender. Extremities: No edema. No obvious deformity.  Musculoskeletal: No obvious joint swelling. Psych: Affect appropriate.  Neurological: Mental Status: Alert. Speech fluent. No pseudobulbar affect Cranial Nerves: CNII: No RAPD. Visual fields grossly intact. CNIII, IV, VI: PERRL. No nystagmus. EOMI. CN V: Facial sensation intact bilaterally to fine touch. [place] clench strong. Jaw jerk***. CN VII: Facial muscles symmetric and strong. No ptosis at rest or after sustained upgaze***. CN VIII: Hearing grossly intact bilaterally. CN IX: No hypophonia. CN X: Palate  elevates symmetrically. CN XI: Full strength shoulder shrug bilaterally. CN XII: Tongue protrusion full and midline. No atrophy or fasciculations. No significant dysarthria*** Motor: Tone is ***. *** fasciculations in *** extremities. *** atrophy. No grip or percussive myotonia.***  Individual muscle group testing (MRC grade out of 5):  Movement     Neck flexion ***    Neck extension ***     Right Left   Shoulder abduction *** ***   Shoulder adduction *** ***   Shoulder ext rotation *** ***   Shoulder int rotation *** ***   Elbow flexion *** ***   Elbow extension *** ***   Wrist extension *** ***   Wrist flexion *** ***   Finger abduction - FDI *** ***   Finger abduction - ADM *** ***   Finger extension *** ***   Finger distal flexion - 2/3 *** ***   Finger distal flexion - 4/5 *** ***   Thumb flexion - FPL *** ***   Thumb abduction - APB *** ***    Hip flexion *** ***   Hip extension *** ***   Hip adduction *** ***   Hip abduction *** ***   Knee extension *** ***   Knee flexion *** ***   Dorsiflexion *** ***   Plantarflexion *** ***   Inversion *** ***   Eversion *** ***   Great toe extension *** ***   Great toe flexion *** ***  Reflexes:  Right Left   Bicep *** ***   Tricep *** ***   BrRad *** ***   Knee *** ***   Ankle *** ***    Pathological Reflexes: Babinski: *** response bilaterally*** Hoffman: *** Troemner: *** Pectoral: *** Palmomental: *** Facial: *** Midline tap: *** Sensation: Pinprick: *** Vibration: *** Temperature: *** Proprioception: *** Coordination: Intact finger-to- nose-finger bilaterally. Romberg negative.*** Gait: Able to rise from chair with arms crossed unassisted. Normal, narrow-based gait. Able to tandem walk. Able to walk on toes and heels.***  Lab and Test Review: Internal labs: 02/26/24: ANA positive (1:80) SPEP: no M protein Anticardiolipin abs negative C3 and C4 wnl DS DNA abs neg SSA and SSB neg Anti-Sm  neg RNP neg Scl-70 neg Vit D wnl TSH low at 0.11 CK 32 ESR 33 CMP wnl  02/10/24: B12: 215 Ferritin 174 CBC w/ diff significant for Plts 479  HbA1c (08/22/23): 5.5  External labs: ***  Imaging/Procedures: MRI brain w/wo contrast (12/13/22 for muscle weakness, numbness, tingling, and fatigue): IMPRESSION: Scattered small foci of FLAIR signal abnormality in the supratentorial white matter are nonspecific, but most likely reflect sequela of minimal chronic small-vessel ischemic change. The appearance is not typical for multiple sclerosis. Otherwise normal brain MRI.  CTA head and neck (02/09/23): 1. Normal head CT. 2. Normal CT angiography of the head and neck. ***  ASSESSMENT: Mallory Collins is a 48 y.o. female who presents for evaluation of ***. *** has a relevant medical history of ***. *** neurological examination is pertinent for ***. Available diagnostic data is significant for ***. This constellation of symptoms and objective data would most likely localize to ***. ***  PLAN: -Blood work: *** ***  -Return to clinic ***  The impression above as well as the plan as outlined below were extensively discussed with the patient (in the company of ***) who voiced understanding. All questions were answered to their satisfaction.  The patient was counseled on pertinent fall precautions per the printed material provided today, and as noted under the Patient Instructions section below.***  When available, results of the above investigations and possible further recommendations will be communicated to the patient via telephone/MyChart. Patient to call office if not contacted after expected testing turnaround time.   Total time spent reviewing records, interview, history/exam, documentation, and coordination of care on day of encounter:  *** min   Thank you for allowing me to participate in patient's care.  If I can answer any additional questions, I would be pleased to do  so.  Venetia Potters, MD   CC: Micheal, Wolm ORN, MD [address] [place] [address]  CC: Referring provider: Odella Refugio PARAS, MD [address] [place],  [place] [address]

## 2024-09-23 ENCOUNTER — Ambulatory Visit: Admitting: Neurology

## 2024-09-30 ENCOUNTER — Telehealth: Admitting: Adult Health

## 2024-09-30 ENCOUNTER — Encounter: Payer: Self-pay | Admitting: Adult Health

## 2024-09-30 ENCOUNTER — Other Ambulatory Visit: Payer: Self-pay | Admitting: Adult Health

## 2024-09-30 DIAGNOSIS — F4000 Agoraphobia, unspecified: Secondary | ICD-10-CM

## 2024-09-30 DIAGNOSIS — F411 Generalized anxiety disorder: Secondary | ICD-10-CM

## 2024-09-30 DIAGNOSIS — F331 Major depressive disorder, recurrent, moderate: Secondary | ICD-10-CM | POA: Diagnosis not present

## 2024-09-30 DIAGNOSIS — F410 Panic disorder [episodic paroxysmal anxiety] without agoraphobia: Secondary | ICD-10-CM

## 2024-09-30 MED ORDER — LORAZEPAM 0.5 MG PO TABS: 0.5000 mg | ORAL_TABLET | Freq: Two times a day (BID) | ORAL | 2 refills | Status: AC

## 2024-09-30 MED ORDER — DULOXETINE HCL 30 MG PO CPEP: 30.0000 mg | ORAL_CAPSULE | Freq: Every day | ORAL | 5 refills | Status: AC

## 2024-09-30 NOTE — Progress Notes (Signed)
 Mallory Collins [id number] 1976/10/31 48 y.o.  Virtual Visit via Video Note  I connected with pt @ on 09/30/24 at  2:00 PM EDT by a video enabled telemedicine application and verified that I am speaking with the correct person using two identifiers.   I discussed the limitations of evaluation and management by telemedicine and the availability of in person appointments. The patient expressed understanding and agreed to proceed.  I discussed the assessment and treatment plan with the patient. The patient was provided an opportunity to ask questions and all were answered. The patient agreed with the plan and demonstrated an understanding of the instructions.   The patient was advised to call back or seek an in-person evaluation if the symptoms worsen or if the condition fails to improve as anticipated.  I provided 15 minutes of non-face-to-face time during this encounter.  The patient was located at home.  The provider was located at [facility].   Angeline LOISE Sayers, NP   Subjective:   Patient ID:  Mallory Collins is a 48 y.o. (DOB March 13, 1976) female.  Chief Complaint: No chief complaint on file.   HPI Mallory Collins presents for follow-up of GAD, MDD, panic attacks, and agoraphobia.   Describes mood today as ok. Denies tearfulness. Mood symptoms - denies depression and irritability. Reports stable interest and motivation. Reports some situational anxiety. Denies recent panic attacks. Denies worry, rumination and over thinking. Reports hormonal changes - working with GYN. Reports mood is stable. Stating I feel like I'm doing alright, nothing spectacular. Feels like the Ativan  and Cymbalta  continues to work well. Taking medications as prescribed.  Energy levels are up and down. Active, does not have a regular exercise routine.  Enjoys some usual interests and activities. Married. Lives with husband and mother - 2 dogs. Spending time with family. Appetite  adequate. Weight loss - 145 pounds.  Sleeps well most nights. Averages 9 - 10 hours. Focus and concentration stable. Completing tasks. Managing aspects of household. Unable to work - Fibromyalgia. Denies SI or HI.  Denies AH or VH. Denies self harm. Denies substance use.  Previous medication trials: Clonazepam - too sedating, Zoloft    Review of Systems:  Review of Systems  Musculoskeletal:  Negative for gait problem.  Neurological:  Negative for tremors.  Psychiatric/Behavioral:         Please refer to HPI    Medications: I have reviewed the patient's current medications.  Current Outpatient Medications  Medication Sig Dispense Refill   albuterol  (VENTOLIN  HFA) 108 (90 Base) MCG/ACT inhaler TAKE 2 PUFFS BY MOUTH EVERY 6 HOURS AS NEEDED FOR WHEEZE OR SHORTNESS OF BREATH 8.5 each 1   cholecalciferol (VITAMIN D ) 1000 units tablet Take 1,000 Units by mouth daily.     DULoxetine  (CYMBALTA ) 30 MG capsule TAKE 1 CAPSULE BY MOUTH EVERY DAY 90 capsule 0   ferrous gluconate  (FERGON) 324 MG tablet TAKE 1 TABLET BY MOUTH DAILY WITH BREAKFAST 90 tablet 1   levothyroxine  (SYNTHROID ) 137 MCG tablet Take 1 tablet (137 mcg total) by mouth daily before breakfast. 90 tablet 0   LORazepam  (ATIVAN ) 0.5 MG tablet Take 1 tablet (0.5 mg total) by mouth 2 (two) times daily. 60 tablet 2   Multiple Vitamin (MULTIVITAMIN) tablet Take 1 tablet by mouth daily.     ondansetron  (ZOFRAN ) 4 MG tablet TAKE 1 TABLET BY MOUTH EVERY 8 HOURS AS NEEDED FOR NAUSEA AND VOMITING 9 tablet 1   propranolol  (INDERAL ) 10 MG tablet TAKE 1 TABLET BY  MOUTH EVERY DAY AS NEEDED 90 tablet 0   No current facility-administered medications for this visit.    Medication Side Effects: None  Allergies: No Known Allergies  Past Medical History:  Diagnosis Date   Agoraphobia    Asthma    Chronic fatigue    GAD (generalized anxiety disorder)    Hypothyroidism    MDD (major depressive disorder)    Panic attacks     Family  History  Problem Relation Age of Onset   Hypertension Mother    Cancer Mother        breast   CAD Mother 31       CABG, stents   Other Father        amyloidosis   Congestive Heart Failure Sister    Heart disease Paternal Grandmother     Social History   Socioeconomic History   Marital status: Married    Spouse name: Not on file   Number of children: Not on file   Years of education: Not on file   Highest education level: Some college, no degree  Occupational History   Not on file  Tobacco Use   Smoking status: Never    Passive exposure: Never   Smokeless tobacco: Never  Vaping Use   Vaping status: Never Used  Substance and Sexual Activity   Alcohol use: Never   Drug use: Never   Sexual activity: Not on file  Other Topics Concern   Not on file  Social History Narrative   Not on file   Social Drivers of Health   Financial Resource Strain: Patient Declined (11/25/2022)   Overall Financial Resource Strain (CARDIA)    Difficulty of Paying Living Expenses: Patient declined  Food Insecurity: Patient Declined (11/25/2022)   Hunger Vital Sign    Worried About Running Out of Food in the Last Year: Patient declined    Ran Out of Food in the Last Year: Patient declined  Transportation Needs: No Transportation Needs (11/25/2022)   PRAPARE - Administrator, Civil Service (Medical): No    Lack of Transportation (Non-Medical): No  Physical Activity: Unknown (11/25/2022)   Exercise Vital Sign    Days of Exercise per Week: 0 days    Minutes of Exercise per Session: Not on file  Stress: No Stress Concern Present (11/25/2022)   [employer] of Occupational Health - Occupational Stress Questionnaire    Feeling of Stress : Only a little  Social Connections: Unknown (11/25/2022)   Social Connection and Isolation Panel    Frequency of Communication with Friends and Family: Patient declined    Frequency of Social Gatherings with Friends and Family: Patient declined     Attends Religious Services: Patient declined    Database administrator or Organizations: Patient declined    Attends Engineer, structural: Not on file    Marital Status: Married  Catering manager Violence: Not on file    Past Medical History, Surgical history, Social history, and Family history were reviewed and updated as appropriate.   Please see review of systems for further details on the patient's review from today.   Objective:   Physical Exam:  There were no vitals taken for this visit.  Physical Exam Constitutional:      General: She is not in acute distress. Musculoskeletal:        General: No deformity.  Neurological:     Mental Status: She is alert and oriented to person, place, and time.  Coordination: Coordination normal.  Psychiatric:        Attention and Perception: Attention and perception normal. She does not perceive auditory or visual hallucinations.        Mood and Affect: Mood normal. Mood is not anxious or depressed. Affect is not labile, blunt, angry or inappropriate.        Speech: Speech normal.        Behavior: Behavior normal.        Thought Content: Thought content normal. Thought content is not paranoid or delusional. Thought content does not include homicidal or suicidal ideation. Thought content does not include homicidal or suicidal plan.        Cognition and Memory: Cognition and memory normal.        Judgment: Judgment normal.     Comments: Insight intact     Lab Review:     Component Value Date/Time   NA 141 02/26/2024 1106   K 3.7 02/26/2024 1106   CL 99 02/26/2024 1106   CO2 30 02/26/2024 1106   GLUCOSE 83 02/26/2024 1106   BUN 9 02/26/2024 1106   CREATININE 0.72 02/26/2024 1106   CALCIUM 9.7 02/26/2024 1106   PROT 7.2 02/26/2024 1106   PROT 7.1 02/26/2024 1106   ALBUMIN 4.3 06/26/2022 1420   AST 14 02/26/2024 1106   AST 14 (L) 06/26/2022 1420   ALT 7 02/26/2024 1106   ALT 6 06/26/2022 1420   ALKPHOS 78  06/26/2022 1420   BILITOT 0.4 02/26/2024 1106   BILITOT 0.4 06/26/2022 1420   GFRNONAA >60 10/22/2022 1337   GFRNONAA >60 06/26/2022 1420   GFRAA  05/22/2010 0100    >60        The eGFR has been calculated using the MDRD equation. This calculation has not been validated in all clinical situations. eGFR's persistently <60 mL/min signify possible Chronic Kidney Disease.       Component Value Date/Time   WBC 9.5 02/10/2024 1104   WBC 9.2 10/22/2022 1337   RBC 5.10 02/10/2024 1104   RBC 5.09 02/10/2024 1104   HGB 14.9 02/10/2024 1104   HCT 44.5 02/10/2024 1104   PLT 479 (H) 02/10/2024 1104   MCV 87.4 02/10/2024 1104   MCH 29.3 02/10/2024 1104   MCHC 33.5 02/10/2024 1104   RDW 13.1 02/10/2024 1104   LYMPHSABS 2.2 02/10/2024 1104   MONOABS 0.7 02/10/2024 1104   EOSABS 0.0 02/10/2024 1104   BASOSABS 0.0 02/10/2024 1104    No results found for: POCLITH, LITHIUM   No results found for: PHENYTOIN, PHENOBARB, VALPROATE, CBMZ   .res Assessment: Plan:    Plan:  PDMP reviewed  Ativan  0.5mg  daily as needed for anxiety/agoraphobia Cymbalta  30mg  daily  RTC 6 months - will call in 3 months for next set of refills.   15 minutes spent dedicated to the care of this patient on the date of this encounter to include pre-visit review of records, ordering of medication, post visit documentation, and face-to-face time with the patient discussing GAD, MDD, panic attacks, and agoraphobia. Discussed continuing current medication regimen.  Patient advised to contact office with any questions, adverse effects, or acute worsening in signs and symptoms.  Discussed potential benefits, risk, and side effects of benzodiazepines to include potential risk of tolerance and dependence, as well as possible drowsiness.  Advised patient not to drive if experiencing drowsiness and to take lowest possible effective dose to minimize risk of dependence and tolerance.  There are no diagnoses  linked to this encounter.  Please see After Visit Summary for patient specific instructions.  Future Appointments  Date Time Provider Department Center  09/30/2024  2:00 PM Kale Dols, Angeline Mattocks, NP [facility] None  10/05/2024 11:00 AM Leigh Venetia CROME, MD [facility] None  03/17/2025  1:00 PM Deveshwar, Maya, MD [facility] None    No orders of the defined types were placed in this encounter.     -------------------------------

## 2024-10-01 NOTE — Progress Notes (Unsigned)
 Initial neurology clinic note  Reason for Evaluation: Consultation requested by Odella Refugio PARAS, MD for an opinion regarding ptosis. My final recommendations will be communicated back to the requesting physician by way of shared medical record or letter to requesting physician via [place]  mail.  HPI: This is [name], a 48 y.o. right-handed female with a medical history of Hashimoto's thyroiditis and fibromyalgia who presents to neurology clinic with the chief complaint of right sided ptosis. The patient is alone today.  Patient states her right eye has always been her weak eye. In the last 6-12 months, her right eye has been drooping. It is a constant drooping. It does seem to be worse the longer her eye is open or at the end of the day or if she is tired. The left eye does not ever have similar symptoms. She thinks her right peripheral vision is bad and blurry when looking to the right. This is also constant but does feel like maybe it is worse when tired. Her vision in other directions is normal.   She denies difficulty chewing or swallowing. She denies dysarthria. She denies orthopnea. She feels like there arms lack strength but thinks legs are strong.  Patient was seen by Dr. Odella at [facility] on 09/01/24. Per notes, patient had a positive ice pack test. She had antibody testing that was negative per notes, but I do not have results. Patient is also not aware of the results but thinks it was 4 different things. Per Dr. Odella there is concern for seronegative MG. She has never been on MG medications.  She has had botox in the past but not this year due to her symptoms. She found initially that botox was giving lift to her lid to help with vision/ptosis, but it stopped helping as much. That is why she went to Dr. Odella to discuss lid surgery.  The patient has not noticed any recent skin rashes nor does she report any constitutional symptoms like fever,  night sweats, anorexia or unintentional weight loss.  EtOH use: Never  Restrictive diet? No Family history of neuropathy/myopathy/neurologic disease? Great maternal aunt had ALS, another great maternal aunt had parkinson's disease  Patient takes Cymbalta  for fibromyalgia. Patient taking vit D and a multivitamin, but not B12.  MEDICATIONS:  Outpatient Encounter Medications as of 10/02/2024  Medication Sig   albuterol  (VENTOLIN  HFA) 108 (90 Base) MCG/ACT inhaler TAKE 2 PUFFS BY MOUTH EVERY 6 HOURS AS NEEDED FOR WHEEZE OR SHORTNESS OF BREATH   cholecalciferol (VITAMIN D ) 1000 units tablet Take 1,000 Units by mouth daily.   DULoxetine  (CYMBALTA ) 30 MG capsule Take 1 capsule (30 mg total) by mouth daily.   ferrous gluconate  (FERGON) 324 MG tablet TAKE 1 TABLET BY MOUTH DAILY WITH BREAKFAST   levothyroxine  (SYNTHROID ) 137 MCG tablet Take 1 tablet (137 mcg total) by mouth daily before breakfast.   LORazepam  (ATIVAN ) 0.5 MG tablet Take 1 tablet (0.5 mg total) by mouth 2 (two) times daily. (Patient taking differently: Take 0.5 mg by mouth every 6 (six) hours as needed.)   Multiple Vitamin (MULTIVITAMIN) tablet Take 1 tablet by mouth daily.   ondansetron  (ZOFRAN ) 4 MG tablet TAKE 1 TABLET BY MOUTH EVERY 8 HOURS AS NEEDED FOR NAUSEA AND VOMITING   propranolol  (INDERAL ) 10 MG tablet TAKE 1 TABLET BY MOUTH EVERY DAY AS NEEDED (Patient taking differently: Take 10 mg by mouth as needed.)   No facility-administered encounter medications on file as of 10/02/2024.  PAST MEDICAL HISTORY: Past Medical History:  Diagnosis Date   Agoraphobia    Asthma    Chronic fatigue    GAD (generalized anxiety disorder)    Hypothyroidism    MDD (major depressive disorder)    Panic attacks     PAST SURGICAL HISTORY: Past Surgical History:  Procedure Laterality Date   WISDOM TOOTH EXTRACTION      ALLERGIES: No Known Allergies  FAMILY HISTORY: Family History  Problem Relation Age of Onset   Hypertension  Mother    Cancer Mother        breast   CAD Mother 42       CABG, stents   Other Father        amyloidosis   Congestive Heart Failure Sister    Heart disease Paternal Grandmother     SOCIAL HISTORY: Social History   Tobacco Use   Smoking status: Never    Passive exposure: Never   Smokeless tobacco: Never  Vaping Use   Vaping status: Never Used  Substance Use Topics   Alcohol use: Never   Drug use: Never   Social History   Social History Narrative   Not on file     OBJECTIVE: PHYSICAL EXAM: BP 134/87   Pulse (!) 116   Wt 143 lb (64.9 kg)   BMI 24.55 kg/m   General: General appearance: Awake and alert. No distress. Cooperative with exam.  Skin: No obvious rash or jaundice. HEENT: Atraumatic. Anicteric. Lungs: Non-labored breathing on room air  Heart: Regular Extremities: No edema. No obvious deformity.  Psych: Affect appropriate.  Neurological: Mental Status: Alert. Speech fluent. No pseudobulbar affect Cranial Nerves: CNII: No RAPD. Visual fields grossly intact. CNIII, IV, VI: PERRL. No nystagmus. EOMI. CN V: Facial sensation intact bilaterally to fine touch. CN VII: Facial muscles: orbicularis oculi mildly weak; orbicularis oris strong. Bilateral ptosis at rest that is potentially worse after sustained up gaze. Contralateral eyelid with more ptosis when lifting eyelid. Cogan lid twitch negative. ICE PACK TEST: Pre 1.2 cm left, 0.9 cm right; Post 1.3 cm left, 1.2 cm right CN VIII: Hearing grossly intact bilaterally. CN IX: No hypophonia. CN X: Palate elevates symmetrically. CN XI: Full strength shoulder shrug bilaterally. CN XII: Tongue protrusion full and midline. No atrophy or fasciculations. No significant dysarthria Motor: Tone is normal. Strength is 5/5 in bilateral upper and lower extremities with no fatigability appreciated.  Reflexes:  Right Left   Bicep 2+ 2+   Tricep 2+ 2+   BrRad 2+ 2+   Knee 2+ 2+   Ankle 2+ 2+    Sensation: Light  touch sensation intact. Coordination: Intact finger-to- nose-finger bilaterally. Romberg negative. Gait: Able to rise from chair with arms crossed unassisted. Normal, narrow-based gait.  Lab and Test Review: Internal labs: 02/26/24: ANA positive (1:80) SPEP: no M protein Anticardiolipin abs negative C3 and C4 wnl DS DNA abs neg SSA and SSB neg Anti-Sm neg RNP neg Scl-70 neg Vit D wnl TSH low at 0.11 CK 32 ESR 33 CMP wnl  02/10/24: B12: 215 Ferritin 174 CBC w/ diff significant for Plts 479  HbA1c (08/22/23): 5.5  Imaging/Procedures: MRI brain w/wo contrast (12/13/22 for muscle weakness, numbness, tingling, and fatigue): IMPRESSION: Scattered small foci of FLAIR signal abnormality in the supratentorial white matter are nonspecific, but most likely reflect sequela of minimal chronic small-vessel ischemic change. The appearance is not typical for multiple sclerosis. Otherwise normal brain MRI.  CTA head and neck (02/09/23): 1. Normal head CT. 2.  Normal CT angiography of the head and neck.  ASSESSMENT: Mallory Collins is a 48 y.o. female who presents for evaluation of ptosis. She has a relevant medical history of Hashimoto's thyroiditis and fibromyalgia. Her neurological examination is pertinent for right > left ptosis that does improve, particularly on the right with ice pack. Available diagnostic data is significant for reportedly normal MG antibodies, though I do not have these results. Patient's exam is concerning for ocular myasthenia gravis. If proper antibodies have been testing (AChR abs) and negative, this could still be seronegative MG. It is odd that symptoms are always more prominent on the right as MG typically fluctuates more. She also has a history of botox, including in this area, so this is another potential etiology which may be difficult to differentiate. I discussed MG with patient, including the diagnosis and treatment. We agreed to EMG with repetitive nerve  stimulation to further evaluate.  PLAN: -Get antibody testing from Dr. Odella -EMG with RNS (right side) - 10/19/24 at 11 am  -Return to clinic to be determined  The impression above as well as the plan as outlined below were extensively discussed with the patient who voiced understanding. All questions were answered to their satisfaction.  When available, results of the above investigations and possible further recommendations will be communicated to the patient via telephone/MyChart. Patient to call office if not contacted after expected testing turnaround time.   Total time spent reviewing records, interview, history/exam, documentation, and coordination of care on day of encounter:  60 min   Thank you for allowing me to participate in patient's care.  If I can answer any additional questions, I would be pleased to do so.  Venetia Potters, MD   CC: Micheal, Wolm ORN, MD [address] [place] [address]  CC: Referring provider: Odella Refugio PARAS, MD [address] [place],  [place] [address]

## 2024-10-02 ENCOUNTER — Encounter: Payer: Self-pay | Admitting: Neurology

## 2024-10-02 ENCOUNTER — Ambulatory Visit (INDEPENDENT_AMBULATORY_CARE_PROVIDER_SITE_OTHER): Admitting: Neurology

## 2024-10-02 VITALS — BP 134/87 | HR 116 | Ht 65.0 in | Wt 143.0 lb

## 2024-10-02 DIAGNOSIS — H02403 Unspecified ptosis of bilateral eyelids: Secondary | ICD-10-CM

## 2024-10-02 DIAGNOSIS — H532 Diplopia: Secondary | ICD-10-CM | POA: Diagnosis not present

## 2024-10-02 NOTE — Patient Instructions (Addendum)
 I saw you today for drooping of the right eyelid. There is concern that this is myasthenia gravis.  I will get the labs you did from Dr. Odella.  I will also get nerve testing to look for myasthenia gravis. We will schedule this on 10/19/24 at 11 am.  We will discuss results after the test.  Go to nearest emergency room if you have severe weakness, difficulty breathing or swallowing as this could be a myasthenic crisis (flare).   The physicians and staff at [facility] are committed to providing excellent care. You may receive a survey requesting feedback about your experience at our office. We strive to receive very good responses to the survey questions. If you feel that your experience would prevent you from giving the office a very good  response, please contact our office to try to remedy the situation. We may be reached at [phone]. Thank you for taking the time out of your busy day to complete the survey.  Venetia Potters, MD [facility]  ELECTROMYOGRAM AND NERVE CONDUCTION STUDIES (EMG/NCS) INSTRUCTIONS  How to Prepare The neurologist conducting the EMG will need to know if you have certain medical conditions. Tell the neurologist and other EMG lab personnel if you: Have a pacemaker or any other electrical medical device Take blood-thinning medications Have hemophilia, a blood-clotting disorder that causes prolonged bleeding Bathing Take a shower or bath shortly before your exam in order to remove oils from your skin. Don't apply lotions or creams before the exam.  What to Expect You'll likely be asked to change into a hospital gown for the procedure and lie down on an examination table. The following explanations can help you understand what will happen during the exam.  Electrodes. The neurologist or a technician places surface electrodes at various locations on your skin depending on where you're experiencing symptoms. Or the neurologist may insert needle  electrodes at different sites depending on your symptoms.  Sensations. The electrodes will at times transmit a tiny electrical current that you may feel as a twinge or spasm. The needle electrode may cause discomfort or pain that usually ends shortly after the needle is removed. If you are concerned about discomfort or pain, you may want to talk to the neurologist about taking a short break during the exam.  Instructions. During the needle EMG, the neurologist will assess whether there is any spontaneous electrical activity when the muscle is at rest - activity that isn't present in healthy muscle tissue - and the degree of activity when you slightly contract the muscle.  He or she will give you instructions on resting and contracting a muscle at appropriate times. Depending on what muscles and nerves the neurologist is examining, he or she may ask you to change positions during the exam.  After your EMG You may experience some temporary, minor bruising where the needle electrode was inserted into your muscle. This bruising should fade within several days. If it persists, contact your primary care doctor.

## 2024-10-05 ENCOUNTER — Encounter: Payer: Self-pay | Admitting: Neurology

## 2024-10-05 ENCOUNTER — Ambulatory Visit: Admitting: Neurology

## 2024-10-16 ENCOUNTER — Other Ambulatory Visit: Payer: Self-pay

## 2024-10-16 DIAGNOSIS — R202 Paresthesia of skin: Secondary | ICD-10-CM

## 2024-10-19 ENCOUNTER — Telehealth: Payer: Self-pay | Admitting: Neurology

## 2024-10-19 ENCOUNTER — Ambulatory Visit (INDEPENDENT_AMBULATORY_CARE_PROVIDER_SITE_OTHER): Admitting: Neurology

## 2024-10-19 DIAGNOSIS — H02403 Unspecified ptosis of bilateral eyelids: Secondary | ICD-10-CM | POA: Diagnosis not present

## 2024-10-19 MED ORDER — PREDNISONE 20 MG PO TABS: 20.0000 mg | ORAL_TABLET | Freq: Every day | ORAL | 0 refills | Status: DC

## 2024-10-19 NOTE — Telephone Encounter (Signed)
 Called and discussed results with patient. Repetitive nerve stimulation did not show a decremental response as would be expected in myasthenia gravis (MG). MG is still possible despite negative antibodies and normal RNS. It is also possible that symptoms are due to chronic botox. Her last botox was in 02/2024, so I am not sure if symptoms would persist this long, but there are reports of botox causing fluctuating ptosis and a positive ice pack test.   I discussed that we had 2 options going forward: single fiber EMG vs trial of prednisone . Single fiber EMG results could just lead to the prednisone  trial though, so patient agreed to try that. The plan will be to take prednisone  20 mg daily for at least 2-4 weeks and gauge symptom response. Patient will let me know in a couple of weeks how things are going and will see me back in clinic on 11/25/24 for me to re-examine. We will determine whether response was consistent with MG at that time.  All questions were answered.  Venetia Potters, MD [facility]

## 2024-10-19 NOTE — Procedures (Signed)
 [facility]  [address] [place], [address]  [place], [place] [address] Tel: [phone] Fax: [phone] Test Date:  10/19/2024  Patient: Mallory Collins DOB: Mar 12, 1976 Physician: Venetia Potters, MD  Sex: Female Height: 5' 5 Ref Phys: Venetia Potters, MD  ID#: [id number]   Technician:    History: This is a 48 year old female with ptosis.  NCV & EMG Findings: Nerve conduction studies of the right upper and lower limbs and right facial nerve and 3 Hz repetitive nerve stimulation of the right median and facial motor nerves shows: Right sural and median sensory responses are within normal limits. Right median (APB) and right facial (nasalis) motor responses are within normal limits. 3 Hz repetitive nerve stimulation of the right median (APB) and facial (nasalis) nerves are within normal limits (no decremental response).  Impression: This is a normal study. Specifically: No electrodiagnostic evidence of a disorder of the neuromuscular junction as evidenced by no decremental response at rest and after exercise with 3 Hz repetitive median and facial nerve stimulation. No electrodiagnostic evidence of a large fiber sensorimotor neuropathy. No electrodiagnostic evidence of a right median mononeuropathy at or distal to the wrist (ie: carpal tunnel syndrome).   ___________________________ Venetia Potters, MD    NCS+ Motor Nerve Results    Latency Amplitude F-Lat Segment Distance CV Comment  Site (ms) Norm (mV) Norm (ms)  (cm) (m/s) Norm   Right Facial - Nasalis Motor  Mastoid 2.4  < 4.2 2.1  > 1.00        Right Median (APB) Motor  Wrist 2.2  < 3.9 11.3  > 6.0        Elbow 6.5 - 10.7 -  Elbow-Wrist 26 60  > 50    Sensory Sites    Neg Peak Lat Amplitude (O-P) Segment Distance Velocity Comment  Site (ms) Norm (V) Norm  (cm) (ms)   Right Median Sensory  Wrist-Dig II 3.1  < 3.4 90  > 20 Wrist-Dig II 13    Right Sural Sensory  Calf-Lat mall 3.6  < 4.5 6  > 5 Calf-Lat mall 14      RNS Right Abductor pollicis brevis   Trial # Label Amp 1 (mV)  O-P Amp 5 (mV)  O-P Amp % Dif Area 1 (mVms) Area 5 (mVms) Area % Dif Rep Rate Train Length Pause Time (min:sec) Comments  Tr 1 Baseline 11.44 11.52 0.7 38.34 34.42 -10.2 3.00 5 00:30   Tr 2 Post Exercise 11.39 11.18 -1.9 38.37 31.89 -16.9 3.00 5 01:00   Tr 3 1 min Post 11.44 11.15 -2.6 36.29 31.82 -12.3 3.00 5 01:00   Tr 4 2 min Post 11.37 11.63 2.3 35.62 33.47 -6.0 3.00 5 01:00   Tr 5 3 min Post 11.61 10.48 -9.8 36.36 29.45 -19.0 3.00 5 00:00    Right Nasalis   Trial # Label Amp 1 (mV)  O-P Amp 3 (mV)  O-P Amp % Dif Area 1 (mVms) Area 3 (mVms) Area % Dif Rep Rate Train Length Pause Time (min:sec) Comments  Tr 1 Baseline 2.18 2.22 1.7 14.21 13.96 -1.7 3.00 5 00:30   Tr 2 Post Exercise 2.08 2.14 2.8 13.49 13.09 -3.0 3.00 5 01:00   Tr 3 1 min Post 2.14 2.14 0.0 13.85 13.14 -5.1 3.00 5 01:00   Tr 4 2 min Post 2.04 2.12 3.7 13.54 13.03 -3.8 3.00 5 01:00   Tr 5 3 min Post 2.09 2.18 4.3 13.48 13.06 -3.1 3.00 5 00:00  Waveforms:  Motor      Sensory

## 2024-11-09 ENCOUNTER — Other Ambulatory Visit: Payer: Self-pay | Admitting: Family Medicine

## 2024-11-13 NOTE — Progress Notes (Signed)
 I saw Mallory Collins in neurology clinic on 11/25/24 in follow up for ptosis.  HPI: Mallory Collins is a 48 y.o. year old female with a history of Hashimoto's thyroiditis and fibromyalgia who we last saw on 10/02/24.  To briefly review: 10/02/24: Patient states her right eye has always been her weak eye. In the last 6-12 months, her right eye has been drooping. It is a constant drooping. It does seem to be worse the longer her eye is open or at the end of the day or if she is tired. The left eye does not ever have similar symptoms. She thinks her right peripheral vision is bad and blurry when looking to the right. This is also constant but does feel like maybe it is worse when tired. Her vision in other directions is normal.    She denies difficulty chewing or swallowing. She denies dysarthria. She denies orthopnea. She feels like there arms lack strength but thinks legs are strong.   Patient was seen by Dr. Odella at [facility] on 09/01/24. Per notes, patient had a positive ice pack test. She had antibody testing that was negative per notes, but I do not have results. Patient is also not aware of the results but thinks it was 4 different things. Per Dr. Odella there is concern for seronegative MG. She has never been on MG medications.   She has had botox in the past but not this year due to her symptoms. She found initially that botox was giving lift to her lid to help with vision/ptosis, but it stopped helping as much. That is why she went to Dr. Odella to discuss lid surgery.   The patient has not noticed any recent skin rashes nor does she report any constitutional symptoms like fever, night sweats, anorexia or unintentional weight loss.   EtOH use: Never  Restrictive diet? No Family history of neuropathy/myopathy/neurologic disease? Great maternal aunt had ALS, another great maternal aunt had parkinson's disease   Patient takes Cymbalta  for fibromyalgia.  Patient taking vit D and a multivitamin, but not B12.  Most recent Assessment and Plan (10/02/24): Mallory Collins is a 48 y.o. female who presents for evaluation of ptosis. She has a relevant medical history of Hashimoto's thyroiditis and fibromyalgia. Her neurological examination is pertinent for right > left ptosis that does improve, particularly on the right with ice pack. Available diagnostic data is significant for reportedly normal MG antibodies, though I do not have these results. Patient's exam is concerning for ocular myasthenia gravis. If proper antibodies have been testing (AChR abs) and negative, this could still be seronegative MG. It is odd that symptoms are always more prominent on the right as MG typically fluctuates more. She also has a history of botox, including in this area, so this is another potential etiology which may be difficult to differentiate. I discussed MG with patient, including the diagnosis and treatment. We agreed to EMG with repetitive nerve stimulation to further evaluate.   PLAN: -Get antibody testing from Dr. Odella -EMG with RNS (right side) - 10/19/24 at 11 am  Since their last visit: I confirmed that AChR (binding, blocking, modulating) and MuSK abs were negative (see media tab).  RNS of right median and facial motor nerves were normal without decrement to suggest MG. Per my phone note from 10/19/24: Called and discussed results with patient. Repetitive nerve stimulation did not show a decremental response as would be expected in myasthenia gravis (MG). MG is still  possible despite negative antibodies and normal RNS. It is also possible that symptoms are due to chronic botox. Her last botox was in 02/2024, so I am not sure if symptoms would persist this long, but there are reports of botox causing fluctuating ptosis and a positive ice pack test.    I discussed that we had 2 options going forward: single fiber EMG vs trial of prednisone . Single fiber EMG  results could just lead to the prednisone  trial though, so patient agreed to try that. The plan will be to take prednisone  20 mg daily for at least 2-4 weeks and gauge symptom response. Patient will let me know in a couple of weeks how things are going and will see me back in clinic on 11/25/24 for me to re-examine. We will determine whether response was consistent with MG at that time.  She does not think the last month of prednisone  has changed her symptoms at all.  Current MG symptoms: Ptosis: Unchanged, still mostly on the right Double vision: not significant Speech: None Chewing: None Swallowing: None Breathing: None Arm strength: None Leg strength: None  Current medications:  -Prednisone  20 mg daily  Side effects: None  She is not currently taking B12.  She notes today that she felt botox was previously helping with her drooping eye (???).   MEDICATIONS:  Outpatient Encounter Medications as of 11/25/2024  Medication Sig   albuterol  (VENTOLIN  HFA) 108 (90 Base) MCG/ACT inhaler TAKE 2 PUFFS BY MOUTH EVERY 6 HOURS AS NEEDED FOR WHEEZE OR SHORTNESS OF BREATH   cholecalciferol (VITAMIN D ) 1000 units tablet Take 1,000 Units by mouth daily.   DULoxetine  (CYMBALTA ) 30 MG capsule Take 1 capsule (30 mg total) by mouth daily.   ferrous gluconate  (FERGON) 324 MG tablet TAKE 1 TABLET BY MOUTH DAILY WITH BREAKFAST   levothyroxine  (SYNTHROID ) 137 MCG tablet Take 1 tablet (137 mcg total) by mouth daily before breakfast.   LORazepam  (ATIVAN ) 0.5 MG tablet Take 1 tablet (0.5 mg total) by mouth 2 (two) times daily. (Patient taking differently: Take 0.5 mg by mouth every 6 (six) hours as needed.)   Multiple Vitamin (MULTIVITAMIN) tablet Take 1 tablet by mouth daily.   ondansetron  (ZOFRAN ) 4 MG tablet TAKE 1 TABLET BY MOUTH EVERY 8 HOURS AS NEEDED FOR NAUSEA AND VOMITING   predniSONE  (DELTASONE ) 20 MG tablet Take 1 tablet (20 mg total) by mouth daily.   propranolol  (INDERAL ) 10 MG tablet TAKE 1  TABLET BY MOUTH EVERY DAY AS NEEDED (Patient taking differently: Take 10 mg by mouth as needed.)   No facility-administered encounter medications on file as of 11/25/2024.    PAST MEDICAL HISTORY: Past Medical History:  Diagnosis Date   Agoraphobia    Asthma    Chronic fatigue    GAD (generalized anxiety disorder)    Hypothyroidism    MDD (major depressive disorder)    Panic attacks     PAST SURGICAL HISTORY: Past Surgical History:  Procedure Laterality Date   WISDOM TOOTH EXTRACTION      ALLERGIES: No Known Allergies  FAMILY HISTORY: Family History  Problem Relation Age of Onset   Hypertension Mother    Cancer Mother        breast   CAD Mother 44       CABG, stents   Other Father        amyloidosis   Congestive Heart Failure Sister    Heart disease Paternal Grandmother     SOCIAL HISTORY: Social History  Tobacco Use   Smoking status: Never    Passive exposure: Never   Smokeless tobacco: Never  Vaping Use   Vaping status: Never Used  Substance Use Topics   Alcohol use: Never   Drug use: Never   Social History   Social History Narrative   Are you right handed or left handed? Right   Are you currently employed ? no   What is your current occupation?    Do you live at home alone?   Who lives with you? husband   What type of home do you live in: 1 story or 2 story? two    Caffiene 6 0z a day    Objective:  Vital Signs:  BP 122/81   Pulse 98   Ht 5' 5 (1.651 m)   Wt 148 lb (67.1 kg)   SpO2 99%   BMI 24.63 kg/m   General: General appearance: Awake and alert. No distress. Cooperative with exam.  Skin: No obvious rash or jaundice. HEENT: Atraumatic. Anicteric. Lungs: Non-labored breathing on room air  Heart: Regular Extremities: No edema.  Neurological: Mental Status: Alert. Speech fluent. No pseudobulbar affect Cranial Nerves: CNII: No RAPD. Visual fields intact. CNIII, IV, VI: PERRL. No nystagmus. EOMI. Possible diplopia with  sustained up gaze? CN V: Facial sensation intact bilaterally to fine touch. CN VII: Facial muscles symmetric and strong. Bilateral ptosis at rest (0.9 cm right; 1.2 cm left - same as 10/02/24). Not worse with sustained up gaze CN VIII: Hears finger rub well bilaterally. CN IX: No hypophonia. CN X: Palate elevates symmetrically. CN XI: Full strength shoulder shrug bilaterally. CN XII: Tongue protrusion full and midline. No atrophy or fasciculations. No significant dysarthria Motor: Tone is normal. Strength is 5/5 in bilateral upper and lower extremities. Reflexes:  Right Left  Bicep 2+ 2+  Tricep 2+ 2+  BrRad 2+ 2+  Knee 2+ 2+  Ankle 2+ 2+   Sensation: Intact to light touch in all extremities Coordination: Intact finger-to- nose-finger bilaterally. Gait: Able to rise from chair with arms crossed unassisted. Normal, narrow-based gait.   Lab and Test Review: New results: External labs (08/25/24): AChR abs (binding, blocking, modulating): negative MuSK abs negative  EMG (10/19/24): NCV & EMG Findings: Nerve conduction studies of the right upper and lower limbs and right facial nerve and 3 Hz repetitive nerve stimulation of the right median and facial motor nerves shows: Right sural and median sensory responses are within normal limits. Right median (APB) and right facial (nasalis) motor responses are within normal limits. 3 Hz repetitive nerve stimulation of the right median (APB) and facial (nasalis) nerves are within normal limits (no decremental response).   Impression: This is a normal study. Specifically: No electrodiagnostic evidence of a disorder of the neuromuscular junction as evidenced by no decremental response at rest and after exercise with 3 Hz repetitive median and facial nerve stimulation. No electrodiagnostic evidence of a large fiber sensorimotor neuropathy. No electrodiagnostic evidence of a right median mononeuropathy at or distal to the wrist (ie: carpal tunnel  syndrome).  Previously reviewed results: 02/26/24: ANA positive (1:80) SPEP: no M protein Anticardiolipin abs negative C3 and C4 wnl DS DNA abs neg SSA and SSB neg Anti-Sm neg RNP neg Scl-70 neg Vit D wnl TSH low at 0.11 CK 32 ESR 33 CMP wnl   02/10/24: B12: 215 Ferritin 174 CBC w/ diff significant for Plts 479   HbA1c (08/22/23): 5.5   Imaging/Procedures: MRI brain w/wo contrast (12/13/22 for muscle weakness,  numbness, tingling, and fatigue): IMPRESSION: Scattered small foci of FLAIR signal abnormality in the supratentorial white matter are nonspecific, but most likely reflect sequela of minimal chronic small-vessel ischemic change. The appearance is not typical for multiple sclerosis. Otherwise normal brain MRI.   CTA head and neck (02/09/23): 1. Normal head CT. 2. Normal CT angiography of the head and neck.  ASSESSMENT: This is Mallory Collins, a 48 y.o. female with ptosis, more significant on right side than left. Exam was concerning for myasthenia gravis vs ptosis due to chronic botox use. This has been present since early 2025. Her AChR and MuSK abs were negative. RNS showed no abnormal decrement. She had a prednisone  trial for 1 month with no clear response. Given this, we agreed to wean off prednisone  and closely monitor symptoms. My presumption at this point is that symptoms are due to chronic botox use and atrophy of levator and not MG, but MG is still possible.  Plan: -Recommend B12 1000 mcg daily -Wean off prednisone : 15 mg for 1 week, then 10 mg for 1 week, then 5 mg for 1 week, then stop -MG crisis symptoms discussed -Patient to call with new or worsening symptoms -Advised to avoid further botox  Return to clinic in 6 months  Total time spent reviewing records, interview, history/exam, documentation, and coordination of care on day of encounter:  45 min  Venetia Potters, MD

## 2024-11-15 ENCOUNTER — Other Ambulatory Visit: Payer: Self-pay | Admitting: Neurology

## 2024-11-15 DIAGNOSIS — H02403 Unspecified ptosis of bilateral eyelids: Secondary | ICD-10-CM

## 2024-11-18 DIAGNOSIS — Z01419 Encounter for gynecological examination (general) (routine) without abnormal findings: Secondary | ICD-10-CM | POA: Diagnosis not present

## 2024-11-18 DIAGNOSIS — N912 Amenorrhea, unspecified: Secondary | ICD-10-CM | POA: Diagnosis not present

## 2024-11-18 DIAGNOSIS — Z124 Encounter for screening for malignant neoplasm of cervix: Secondary | ICD-10-CM | POA: Diagnosis not present

## 2024-11-18 DIAGNOSIS — Z6824 Body mass index (BMI) 24.0-24.9, adult: Secondary | ICD-10-CM | POA: Diagnosis not present

## 2024-11-19 DIAGNOSIS — M5440 Lumbago with sciatica, unspecified side: Secondary | ICD-10-CM | POA: Diagnosis not present

## 2024-11-19 DIAGNOSIS — M533 Sacrococcygeal disorders, not elsewhere classified: Secondary | ICD-10-CM | POA: Diagnosis not present

## 2024-11-25 ENCOUNTER — Ambulatory Visit (INDEPENDENT_AMBULATORY_CARE_PROVIDER_SITE_OTHER): Admitting: Neurology

## 2024-11-25 ENCOUNTER — Encounter: Payer: Self-pay | Admitting: Neurology

## 2024-11-25 DIAGNOSIS — H02403 Unspecified ptosis of bilateral eyelids: Secondary | ICD-10-CM | POA: Diagnosis not present

## 2024-11-25 MED ORDER — PREDNISONE 5 MG PO TABS: ORAL_TABLET | ORAL | 0 refills | Status: AC

## 2024-11-25 NOTE — Patient Instructions (Addendum)
 We will wean off your prednisone  as it did not seem to help: -I prescribed you 5 mg tablets. You will take 15 mg for 1 week, then 10 mg for 1 week, then 5 mg for 1 week, then stop  If you notice worsening symptoms, go back to previous dose of prednisone  and call me.  Your B12 was borderline low. Given your symptoms, I would recommend supplementing with B12 1000 mcg daily. This can be bought over the counter at any local drug store or online.  Go to nearest emergency room if you have severe weakness, difficulty breathing or swallowing as this could be a myasthenic crisis (flare).  I will continue to monitor you closely. I will see you back in clinic in 6 months. Please let me know if you have any questions or concerns in the meantime.  The physicians and staff at [facility] are committed to providing excellent care. You may receive a survey requesting feedback about your experience at our office. We strive to receive very good responses to the survey questions. If you feel that your experience would prevent you from giving the office a very good  response, please contact our office to try to remedy the situation. We may be reached at [phone]. Thank you for taking the time out of your busy day to complete the survey.  Venetia Potters, MD [facility]

## 2024-11-30 ENCOUNTER — Other Ambulatory Visit: Payer: Self-pay | Admitting: Family Medicine

## 2024-12-09 ENCOUNTER — Encounter: Payer: Self-pay | Admitting: Family Medicine

## 2024-12-09 ENCOUNTER — Ambulatory Visit: Admitting: Family Medicine

## 2024-12-09 VITALS — BP 136/84 | HR 113 | Temp 97.7°F | Wt 152.2 lb

## 2024-12-09 DIAGNOSIS — E039 Hypothyroidism, unspecified: Secondary | ICD-10-CM | POA: Diagnosis not present

## 2024-12-09 MED ORDER — LEVOTHYROXINE SODIUM 137 MCG PO TABS: 137.0000 ug | ORAL_TABLET | Freq: Every day | ORAL | 3 refills | Status: AC

## 2024-12-09 NOTE — Progress Notes (Signed)
 Established Patient Office Visit  Subjective   Patient ID: Mallory Collins, female    DOB: March 11, 1976  Age: 48 y.o. MRN: [id number]  Chief Complaint  Patient presents with   Medication Refill    HPI    Mallory Collins is seen for follow-up regarding hypothyroidism.  She is currently on levothyroxine  137 mcg daily.  She actually ran out of her medication about a week ago and has been off for the past week.  Her last TSH was slightly over replaced.  She has been followed by several specialist recently including rheumatologist.  She has had extensive testing through them and is felt to have fibromyalgia.  She had some issues with upper eyelid droop and her ophthalmologist initially had concerns regarding myasthenia but this was not confirmed on further neurologic testing-with neurologist.  She has had some chronic back issues, especially lumbar spine.  She feels like her large breasts are contributing likely to this.  She has more of a stooped posture and feels weaker in the trunk region  Past Medical History:  Diagnosis Date   Agoraphobia    Asthma    Chronic fatigue    GAD (generalized anxiety disorder)    Hypothyroidism    MDD (major depressive disorder)    Panic attacks    Past Surgical History:  Procedure Laterality Date   WISDOM TOOTH EXTRACTION      reports that she has never smoked. She has never been exposed to tobacco smoke. She has never used smokeless tobacco. She reports that she does not drink alcohol and does not use drugs. family history includes CAD (age of onset: 35) in her mother; Cancer in her mother; Congestive Heart Failure in her sister; Heart disease in her paternal grandmother; Hypertension in her mother; Other in her father. No Known Allergies  Review of Systems  Constitutional:  Negative for weight loss.  Respiratory:  Negative for shortness of breath.   Cardiovascular:  Negative for chest pain.      Objective:     BP 136/84   Pulse (!) 113   Temp  97.7 F (36.5 C) (Oral)   Wt 152 lb 3.2 oz (69 kg)   SpO2 98%   BMI 25.33 kg/m  BP Readings from Last 3 Encounters:  12/09/24 136/84  11/25/24 122/81  10/02/24 134/87   Wt Readings from Last 3 Encounters:  12/09/24 152 lb 3.2 oz (69 kg)  11/25/24 148 lb (67.1 kg)  10/02/24 143 lb (64.9 kg)      Physical Exam Vitals reviewed.  Constitutional:      General: She is not in acute distress.    Appearance: She is not ill-appearing.  Neck:     Comments: No thyromegaly noted Cardiovascular:     Rate and Rhythm: Normal rate and regular rhythm.     Heart sounds: No murmur heard. Pulmonary:     Effort: Pulmonary effort is normal.     Breath sounds: Normal breath sounds.  Musculoskeletal:     Cervical back: Neck supple. No tenderness.  Lymphadenopathy:     Cervical: No cervical adenopathy.  Neurological:     Mental Status: She is alert.      No results found for any visits on 12/09/24.    The 10-year ASCVD risk score (Arnett DK, et al., 2019) is: 1.2%    Assessment & Plan:   Problem List Items Addressed This Visit       Unprioritized   Hypothyroidism - Primary   Relevant Medications  levothyroxine  (SYNTHROID ) 137 MCG tablet   Other Relevant Orders   TSH   T4, Free  Hypothyroidism.  Patient on levothyroxine  137 mcg daily.  However, she ran out of medication about a week ago.  We refilled her medication and recommend she stay on this daily for 2 months and then follow-up for TSH and free T4 and adjust medication at that point if indicated.  She is taking this appropriately first thing in the morning on empty stomach  No follow-ups on file.    Wolm Scarlet, MD

## 2024-12-09 NOTE — Patient Instructions (Signed)
 Be sure to set up thyroid  labs in about 2 months.

## 2024-12-17 ENCOUNTER — Other Ambulatory Visit: Payer: Self-pay | Admitting: Neurology

## 2024-12-17 DIAGNOSIS — H02403 Unspecified ptosis of bilateral eyelids: Secondary | ICD-10-CM

## 2025-02-01 ENCOUNTER — Encounter: Payer: Self-pay | Admitting: Gastroenterology

## 2025-03-17 ENCOUNTER — Ambulatory Visit: Admitting: Rheumatology

## 2025-03-31 ENCOUNTER — Telehealth: Admitting: Adult Health

## 2025-05-27 ENCOUNTER — Ambulatory Visit: Admitting: Neurology
